# Patient Record
Sex: Male | Born: 1975 | Race: Black or African American | Hispanic: No | Smoking: Current every day smoker
Health system: Southern US, Community
[De-identification: ages and names within clinical notes are randomized; demographics above are authoritative.]

## PROBLEM LIST (undated history)

## (undated) DIAGNOSIS — A048 Other specified bacterial intestinal infections: Secondary | ICD-10-CM

## (undated) DIAGNOSIS — E119 Type 2 diabetes mellitus without complications: Secondary | ICD-10-CM

## (undated) DIAGNOSIS — E785 Hyperlipidemia, unspecified: Secondary | ICD-10-CM

## (undated) DIAGNOSIS — E669 Obesity, unspecified: Secondary | ICD-10-CM

## (undated) DIAGNOSIS — K8689 Other specified diseases of pancreas: Secondary | ICD-10-CM

## (undated) DIAGNOSIS — K760 Fatty (change of) liver, not elsewhere classified: Secondary | ICD-10-CM

## (undated) HISTORY — DX: Other specified diseases of pancreas: K86.89

## (undated) HISTORY — DX: Fatty (change of) liver, not elsewhere classified: K76.0

## (undated) HISTORY — DX: Type 2 diabetes mellitus without complications: E11.9

## (undated) HISTORY — DX: Hyperlipidemia, unspecified: E78.5

## (undated) HISTORY — DX: Obesity, unspecified: E66.9

## (undated) HISTORY — DX: Other specified bacterial intestinal infections: A04.8

---

## 1998-07-06 ENCOUNTER — Emergency Department (HOSPITAL_COMMUNITY): Admission: EM | Admit: 1998-07-06 | Discharge: 1998-07-07 | Payer: Self-pay | Admitting: Internal Medicine

## 2003-03-30 ENCOUNTER — Emergency Department (HOSPITAL_COMMUNITY): Admission: EM | Admit: 2003-03-30 | Discharge: 2003-03-30 | Payer: Self-pay | Admitting: Emergency Medicine

## 2004-09-29 ENCOUNTER — Emergency Department (HOSPITAL_COMMUNITY): Admission: EM | Admit: 2004-09-29 | Discharge: 2004-09-29 | Payer: Self-pay | Admitting: Emergency Medicine

## 2006-10-04 ENCOUNTER — Emergency Department (HOSPITAL_COMMUNITY): Admission: EM | Admit: 2006-10-04 | Discharge: 2006-10-05 | Payer: Self-pay | Admitting: Emergency Medicine

## 2006-11-13 ENCOUNTER — Emergency Department (HOSPITAL_COMMUNITY): Admission: EM | Admit: 2006-11-13 | Discharge: 2006-11-13 | Payer: Self-pay | Admitting: Emergency Medicine

## 2007-03-28 ENCOUNTER — Emergency Department (HOSPITAL_COMMUNITY): Admission: EM | Admit: 2007-03-28 | Discharge: 2007-03-29 | Payer: Self-pay | Admitting: Emergency Medicine

## 2007-04-06 ENCOUNTER — Emergency Department (HOSPITAL_COMMUNITY): Admission: EM | Admit: 2007-04-06 | Discharge: 2007-04-06 | Payer: Self-pay | Admitting: Emergency Medicine

## 2007-12-26 HISTORY — PX: TOOTH EXTRACTION: SUR596

## 2015-05-28 ENCOUNTER — Encounter (HOSPITAL_COMMUNITY): Payer: Self-pay

## 2015-05-28 ENCOUNTER — Emergency Department (INDEPENDENT_AMBULATORY_CARE_PROVIDER_SITE_OTHER)
Admission: EM | Admit: 2015-05-28 | Discharge: 2015-05-28 | Disposition: A | Discharge status: Home / Self Care | Payer: Medicaid Other | Source: Home / Self Care | Attending: Family Medicine | Admitting: Family Medicine

## 2015-05-28 DIAGNOSIS — L255 Unspecified contact dermatitis due to plants, except food: Secondary | ICD-10-CM

## 2015-05-28 MED ORDER — TRIAMCINOLONE ACETONIDE 0.1 % EX CREA: 1.0000 "application " | TOPICAL_CREAM | Freq: Two times a day (BID) | CUTANEOUS | Status: DC

## 2015-05-28 MED ORDER — TRIAMCINOLONE ACETONIDE 40 MG/ML IJ SUSP
40.0000 mg | Freq: Once | INTRAMUSCULAR | Status: AC
  Administered 2015-05-28: 40 mg via INTRAMUSCULAR

## 2015-05-28 MED ORDER — TRIAMCINOLONE ACETONIDE 40 MG/ML IJ SUSP
INTRAMUSCULAR | Status: AC
  Filled 2015-05-28: qty 1

## 2015-05-28 MED ORDER — PREDNISONE 20 MG PO TABS: ORAL_TABLET | ORAL | Status: DC

## 2015-05-28 NOTE — ED Notes (Signed)
Noted rash on arms, face, abdominal area since working outside in [place]

## 2015-05-28 NOTE — Discharge Instructions (Signed)
Poison Ivy May also use caladryl lotion after using the triamcinolone. Take CLaritin or Zyrtec, may use benadryl at home. Start the prednisone tomorrow. Poison ivy is a inflammation of the skin (contact dermatitis) caused by touching the allergens on the leaves of the ivy plant following previous exposure to the plant. The rash usually appears 48 hours after exposure. The rash is usually bumps (papules) or blisters (vesicles) in a linear pattern. Depending on your own sensitivity, the rash may simply cause redness and itching, or it may also progress to blisters which may break open. These must be well cared for to prevent secondary bacterial (germ) infection, followed by scarring. Keep any open areas dry, clean, dressed, and covered with an antibacterial ointment if needed. The eyes may also get puffy. The puffiness is worst in the morning and gets better as the day progresses. This dermatitis usually heals without scarring, within 2 to 3 weeks without treatment. HOME CARE INSTRUCTIONS  Thoroughly wash with soap and water as soon as you have been exposed to poison ivy. You have about one half hour to remove the plant resin before it will cause the rash. This washing will destroy the oil or antigen on the skin that is causing, or will cause, the rash. Be sure to wash under your fingernails as any plant resin there will continue to spread the rash. Do not rub skin vigorously when washing affected area. Poison ivy cannot spread if no oil from the plant remains on your body. A rash that has progressed to weeping sores will not spread the rash unless you have not washed thoroughly. It is also important to wash any clothes you have been wearing as these may carry active allergens. The rash will return if you wear the unwashed clothing, even several days later. Avoidance of the plant in the future is the best measure. Poison ivy plant can be recognized by the number of leaves. Generally, poison ivy has three leaves  with flowering branches on a single stem. Diphenhydramine may be purchased over the counter and used as needed for itching. Do not drive with this medication if it makes you drowsy.Ask your caregiver about medication for children. SEEK MEDICAL CARE IF:  Open sores develop.  Redness spreads beyond area of rash.  You notice purulent (pus-like) discharge.  You have increased pain.  Other signs of infection develop (such as fever). Document Released: 12/08/2000 Document Revised: 03/04/2012 Document Reviewed: 05/21/2009 [facility] Patient Information 2015 [place], [place]. This information is not intended to replace advice given to you by your health care provider. Make sure you discuss any questions you have with your health care provider.  Contact Dermatitis Contact dermatitis is a reaction to certain substances that touch the skin. Contact dermatitis can be either irritant contact dermatitis or allergic contact dermatitis. Irritant contact dermatitis does not require previous exposure to the substance for a reaction to occur.Allergic contact dermatitis only occurs if you have been exposed to the substance before. Upon a repeat exposure, your body reacts to the substance.  CAUSES  Many substances can cause contact dermatitis. Irritant dermatitis is most commonly caused by repeated exposure to mildly irritating substances, such as:  Makeup.  Soaps.  Detergents.  Bleaches.  Acids.  Metal salts, such as nickel. Allergic contact dermatitis is most commonly caused by exposure to:  Poisonous plants.  Chemicals (deodorants, shampoos).  Jewelry.  Latex.  Neomycin in triple antibiotic cream.  Preservatives in products, including clothing. SYMPTOMS  The area of skin that is  exposed may develop:  Dryness or flaking.  Redness.  Cracks.  Itching.  Pain or a burning sensation.  Blisters. With allergic contact dermatitis, there may also be swelling in areas such as the eyelids,  mouth, or genitals.  DIAGNOSIS  Your caregiver can usually tell what the problem is by doing a physical exam. In cases where the cause is uncertain and an allergic contact dermatitis is suspected, a patch skin test may be performed to help determine the cause of your dermatitis. TREATMENT Treatment includes protecting the skin from further contact with the irritating substance by avoiding that substance if possible. Barrier creams, powders, and gloves may be helpful. Your caregiver may also recommend:  Steroid creams or ointments applied 2 times daily. For best results, soak the rash area in cool water for 20 minutes. Then apply the medicine. Cover the area with a plastic wrap. You can store the steroid cream in the refrigerator for a "chilly" effect on your rash. That may decrease itching. Oral steroid medicines may be needed in more severe cases.  Antibiotics or antibacterial ointments if a skin infection is present.  Antihistamine lotion or an antihistamine taken by mouth to ease itching.  Lubricants to keep moisture in your skin.  Burow's solution to reduce redness and soreness or to dry a weeping rash. Mix one packet or tablet of solution in 2 cups cool water. Dip a clean washcloth in the mixture, wring it out a bit, and put it on the affected area. Leave the cloth in place for 30 minutes. Do this as often as possible throughout the day.  Taking several cornstarch or baking soda baths daily if the area is too large to cover with a washcloth. Harsh chemicals, such as alkalis or acids, can cause skin damage that is like a burn. You should flush your skin for 15 to 20 minutes with cold water after such an exposure. You should also seek immediate medical care after exposure. Bandages (dressings), antibiotics, and pain medicine may be needed for severely irritated skin.  HOME CARE INSTRUCTIONS  Avoid the substance that caused your reaction.  Keep the area of skin that is affected away from hot  water, soap, sunlight, chemicals, acidic substances, or anything else that would irritate your skin.  Do not scratch the rash. Scratching may cause the rash to become infected.  You may take cool baths to help stop the itching.  Only take over-the-counter or prescription medicines as directed by your caregiver.  See your caregiver for follow-up care as directed to make sure your skin is healing properly. SEEK MEDICAL CARE IF:   Your condition is not better after 3 days of treatment.  You seem to be getting worse.  You see signs of infection such as swelling, tenderness, redness, soreness, or warmth in the affected area.  You have any problems related to your medicines. Document Released: 12/08/2000 Document Revised: 03/04/2012 Document Reviewed: 05/16/2011 [facility] Patient Information 2015 [place], [place]. This information is not intended to replace advice given to you by your health care provider. Make sure you discuss any questions you have with your health care provider.

## 2015-05-28 NOTE — ED Provider Notes (Signed)
CSN: [id number]     Arrival date & time 05/28/15  1901 History   First MD Initiated Contact with Patient 05/28/15 2019     Chief Complaint  Patient presents with  . Skin Problem   (Consider location/radiation/quality/duration/timing/severity/associated sxs/prior Treatment) HPI Comments: 39 year old male was pulling down vines from a tree one week ago. The next day he developed a crusty, itchy rash to the mid chest, both arms and face. He is complaining of a nature rash. Denies involvement of the eyes. Denies systemic symptoms.   History reviewed. No pertinent past medical history. History reviewed. No pertinent past surgical history. History reviewed. No pertinent family history. History  Substance Use Topics  . Smoking status: Current Every Day Smoker  . Smokeless tobacco: Not on file  . Alcohol Use: Not on file    Review of Systems  Constitutional: Negative.   Skin: Positive for rash.  All other systems reviewed and are negative.   Allergies  Review of patient's allergies indicates not on file.  Home Medications   Prior to Admission medications   Medication Sig Start Date End Date Taking? Authorizing Provider  predniSONE (DELTASONE) 20 MG tablet 3 Tabs PO Days 1-3, then 2 tabs PO Days 4-6, then 1 tab PO Day 7-9, then Half Tab PO Day 10-12 05/28/15   Janne Napoleon, NP  triamcinolone cream (KENALOG) 0.1 % Apply 1 application topically 2 (two) times daily. 05/28/15   Janne Napoleon, NP   BP 129/89 mmHg  Pulse 91  Temp(Src) 98.7 F (37.1 C) (Oral)  Resp 16  SpO2 95% Physical Exam  Constitutional: He is oriented to person, place, and time. He appears well-developed and well-nourished. No distress.  Neck: Normal range of motion. Neck supple.  Pulmonary/Chest: Effort normal. No respiratory distress.  Musculoskeletal: He exhibits no edema.  Neurological: He is alert and oriented to person, place, and time. He exhibits normal muscle tone.  Skin: Skin is warm and dry.  Papular and  papulovesicular lesions to the anterior chest, bilateral arms and face. Several lesions are draining  serous type fluid.  Psychiatric: He has a normal mood and affect.  Nursing note and vitals reviewed.   ED Course  Procedures (including critical care time) Labs Review Labs Reviewed - No data to display  Imaging Review No results found.   MDM   1. Rhus dermatitis   Kenalog 40 mg IM now. May also use caladryl lotion after using the triamcinolone cream BID Take CLaritin or Zyrtec, may use benadryl at home. Start the prednisone tomorrow. Cold compresses   Janne Napoleon, NP 05/28/15 2055

## 2016-09-26 ENCOUNTER — Encounter (HOSPITAL_COMMUNITY): Payer: Self-pay | Admitting: Nurse Practitioner

## 2016-09-26 ENCOUNTER — Emergency Department (HOSPITAL_COMMUNITY)
Admission: EM | Admit: 2016-09-26 | Discharge: 2016-09-26 | Disposition: A | Discharge status: Home / Self Care | Payer: Medicaid Other | Attending: Emergency Medicine | Admitting: Emergency Medicine

## 2016-09-26 DIAGNOSIS — Z87891 Personal history of nicotine dependence: Secondary | ICD-10-CM | POA: Insufficient documentation

## 2016-09-26 DIAGNOSIS — Z8719 Personal history of other diseases of the digestive system: Secondary | ICD-10-CM

## 2016-09-26 DIAGNOSIS — K625 Hemorrhage of anus and rectum: Secondary | ICD-10-CM | POA: Insufficient documentation

## 2016-09-26 LAB — COMPREHENSIVE METABOLIC PANEL
ALT: 28 U/L (ref 17–63)
ANION GAP: 6 (ref 5–15)
AST: 23 U/L (ref 15–41)
Albumin: 3.8 g/dL (ref 3.5–5.0)
Alkaline Phosphatase: 59 U/L (ref 38–126)
BUN: 8 mg/dL (ref 6–20)
CHLORIDE: 108 mmol/L (ref 101–111)
CO2: 24 mmol/L (ref 22–32)
Calcium: 9.4 mg/dL (ref 8.9–10.3)
Creatinine, Ser: 0.89 mg/dL (ref 0.61–1.24)
GFR calc Af Amer: >60 mL/min (ref >60)
GFR calc non Af Amer: >60 mL/min (ref >60)
Glucose, Bld: 85 mg/dL (ref 65–99)
Potassium: 3.9 mmol/L (ref 3.5–5.1)
SODIUM: 138 mmol/L (ref 135–145)
Total Bilirubin: 0.4 mg/dL (ref 0.3–1.2)
Total Protein: 7.4 g/dL (ref 6.5–8.1)

## 2016-09-26 LAB — CBC WITH DIFFERENTIAL/PLATELET
Basophils Absolute: 0.1 10*3/uL (ref 0.0–0.1)
Basophils Relative: 1 %
Eosinophils Absolute: 0.3 10*3/uL (ref 0.0–0.7)
Eosinophils Relative: 5 %
HCT: 47.2 % (ref 39.0–52.0)
Hemoglobin: 15.6 g/dL (ref 13.0–17.0)
Lymphocytes Relative: 41 %
Lymphs Abs: 2.5 10*3/uL (ref 0.7–4.0)
MCH: 27.8 pg (ref 26.0–34.0)
MCHC: 33.1 g/dL (ref 30.0–36.0)
MCV: 84.1 fL (ref 78.0–100.0)
Monocytes Absolute: 0.7 10*3/uL (ref 0.1–1.0)
Monocytes Relative: 12 %
Neutro Abs: 2.5 10*3/uL (ref 1.7–7.7)
Neutrophils Relative %: 41 %
Platelets: 276 10*3/uL (ref 150–400)
RBC: 5.61 MIL/uL (ref 4.22–5.81)
RDW: 13.1 % (ref 11.5–15.5)
WBC: 6 10*3/uL (ref 4.0–10.5)

## 2016-09-26 LAB — LIPASE, BLOOD: LIPASE: 31 U/L (ref 11–51)

## 2016-09-26 LAB — POC OCCULT BLOOD, ED: Fecal Occult Bld: NEGATIVE

## 2016-09-26 NOTE — ED Provider Notes (Signed)
[facility] DEPT Provider Note   CSN: [id number] Arrival date & time: 09/26/16  1053     History   Chief Complaint Chief Complaint  Patient presents with  . Rectal Bleeding    HPI Brian Diaz is a 40 y.o. male.  40 year old healthy male who presents with rectal bleeding. 2 days ago, the patient had a bowel movement and noticed that there was dark red blood in the toilet water. Yesterday he had another bowel movement in which he saw a small clot and also dark red toilet water. He has not had a bowel movement today. He denies any history of preceding constipation or diarrhea. No associated nausea or vomiting. He had occasional brief, sharp abdominal pains near his umbilicus yesterday but denies any pain today. No rectal pain during or after bowel movements. No history of hemorrhoids. No NSAID or alcohol use. No family history of IBD or early colon cancer.   The history is provided by the patient.    History reviewed. No pertinent past medical history.  There are no active problems to display for this patient.   History reviewed. No pertinent surgical history.     Home Medications    Prior to Admission medications   Medication Sig Start Date End Date Taking? Authorizing Provider  predniSONE (DELTASONE) 20 MG tablet 3 Tabs PO Days 1-3, then 2 tabs PO Days 4-6, then 1 tab PO Day 7-9, then Half Tab PO Day 10-12 05/28/15   Janne Napoleon, NP  triamcinolone cream (KENALOG) 0.1 % Apply 1 application topically 2 (two) times daily. 05/28/15   Janne Napoleon, NP    Family History History reviewed. No pertinent family history.  Social History Social History  Substance Use Topics  . Smoking status: Former Smoker    Quit date: 09/27/2007  . Smokeless tobacco: Never Used  . Alcohol use Yes     Comment: occasional      Allergies   Review of patient's allergies indicates no known allergies.   Review of Systems Review of Systems 10 Systems reviewed and are negative for acute  change except as noted in the HPI.   Physical Exam Updated Vital Signs BP 126/96 (BP Location: Left Arm)   Pulse 81   Temp 98.3 F (36.8 C) (Oral)   Resp 18   Ht 5\' 9"  (1.753 m)   Wt 300 lb (136.1 kg)   SpO2 99%   BMI 44.30 kg/m   Physical Exam  Constitutional: He is oriented to person, place, and time. He appears well-developed and well-nourished. No distress.  HENT:  Head: Normocephalic and atraumatic.  Moist mucous membranes  Eyes: Conjunctivae are normal. Pupils are equal, round, and reactive to light.  Neck: Neck supple.  Cardiovascular: Normal rate, regular rhythm and normal heart sounds.   No murmur heard. Pulmonary/Chest: Effort normal and breath sounds normal.  Abdominal: Soft. Bowel sounds are normal. He exhibits no distension. There is no tenderness.  Genitourinary:  Genitourinary Comments: Normal rectal tone, no gross blood  Musculoskeletal: He exhibits no edema.  Neurological: He is alert and oriented to person, place, and time.  Fluent speech  Skin: Skin is warm and dry.  Psychiatric: He has a normal mood and affect. Judgment normal.  Nursing note and vitals reviewed.  10 Systems reviewed and are negative for acute change except as noted in the HPI.   ED Treatments / Results  Labs (all labs ordered are listed, but only abnormal results are displayed) Labs Reviewed  COMPREHENSIVE METABOLIC PANEL  LIPASE, BLOOD  CBC WITH DIFFERENTIAL/PLATELET  POC OCCULT BLOOD, ED    EKG  EKG Interpretation None       Radiology No results found.  Procedures Procedures (including critical care time)  Medications Ordered in ED Medications - No data to display   Initial Impression / Assessment and Plan / ED Course  I have reviewed the triage vital signs and the nursing notes.  Pertinent labs that were available during my care of the patient were reviewed by me and considered in my medical decision making (see chart for details).  Clinical Course     Patient with 2 bowel movements over the past 2 days with dark red blood. No bowel movements today. He was well-appearing with reassuring vital signs on exam. No complaints of pain and no abdominal tenderness.  All labwork unremarkable and Hemoccult negative. He was well-appearing on reexamination.  Discussed with him supportive care including avoidance of NSAIDs and alcohol and importance of follow-up with PCP to arrange for colonoscopy if his symptoms continue. Extensively reviewed return precautions including heavy bleeding, abdominal pain, or lightheadedness. Patient voiced understanding and was discharged in satisfactory condition. Final Clinical Impressions(s) / ED Diagnoses   Final diagnoses:  History of bloody stools    New Prescriptions New Prescriptions   No medications on file     Sharlett Iles, MD 09/26/16 1442

## 2016-09-26 NOTE — ED Triage Notes (Signed)
Pt endorses rectal bleeding started on Sunday. Pt sts had a BM and looked in toilet and it was dark red toilet water. Pt sts yesterday had BM and patient saw small clot and dark red toilet water. Pt denies abdominal pain at present time but notes intermittent mid sharp abdominal pain a few times lasting a few seconds. Patient denies n/v/d/constipation. Pt sts he has regular daily BMs. Denies hx of hemorrhoids

## 2016-10-13 ENCOUNTER — Ambulatory Visit (HOSPITAL_COMMUNITY)
Admission: EM | Admit: 2016-10-13 | Discharge: 2016-10-13 | Discharge status: Absent without leave | Payer: Medicaid Other

## 2016-12-19 ENCOUNTER — Emergency Department (HOSPITAL_COMMUNITY): Payer: Medicaid Other

## 2016-12-19 ENCOUNTER — Emergency Department (HOSPITAL_COMMUNITY)
Admission: EM | Admit: 2016-12-19 | Discharge: 2016-12-19 | Disposition: A | Discharge status: Home / Self Care | Payer: Medicaid Other | Attending: Emergency Medicine | Admitting: Emergency Medicine

## 2016-12-19 ENCOUNTER — Encounter (HOSPITAL_COMMUNITY): Payer: Self-pay | Admitting: Vascular Surgery

## 2016-12-19 DIAGNOSIS — R52 Pain, unspecified: Secondary | ICD-10-CM

## 2016-12-19 DIAGNOSIS — R1010 Upper abdominal pain, unspecified: Secondary | ICD-10-CM | POA: Diagnosis present

## 2016-12-19 DIAGNOSIS — R1013 Epigastric pain: Secondary | ICD-10-CM

## 2016-12-19 DIAGNOSIS — Z87891 Personal history of nicotine dependence: Secondary | ICD-10-CM | POA: Diagnosis not present

## 2016-12-19 LAB — URINALYSIS, ROUTINE W REFLEX MICROSCOPIC
Bilirubin Urine: NEGATIVE
Glucose, UA: NEGATIVE mg/dL
Ketones, ur: 5 mg/dL — AB
Leukocytes, UA: NEGATIVE
Nitrite: NEGATIVE
Protein, ur: 30 mg/dL — AB
Specific Gravity, Urine: 1.018 (ref 1.005–1.030)
pH: 5 (ref 5.0–8.0)

## 2016-12-19 LAB — COMPREHENSIVE METABOLIC PANEL
ALT: 28 U/L (ref 17–63)
AST: 28 U/L (ref 15–41)
Albumin: 3.9 g/dL (ref 3.5–5.0)
Alkaline Phosphatase: 59 U/L (ref 38–126)
Anion gap: 10 (ref 5–15)
BUN: 10 mg/dL (ref 6–20)
CO2: 25 mmol/L (ref 22–32)
Calcium: 9.6 mg/dL (ref 8.9–10.3)
Chloride: 102 mmol/L (ref 101–111)
Creatinine, Ser: 1.18 mg/dL (ref 0.61–1.24)
GFR calc Af Amer: >60 mL/min (ref >60)
GFR calc non Af Amer: >60 mL/min (ref >60)
Glucose, Bld: 103 mg/dL — ABNORMAL HIGH (ref 65–99)
Potassium: 4 mmol/L (ref 3.5–5.1)
Sodium: 137 mmol/L (ref 135–145)
Total Bilirubin: 0.5 mg/dL (ref 0.3–1.2)
Total Protein: 7.7 g/dL (ref 6.5–8.1)

## 2016-12-19 LAB — CBC
HCT: 45.3 % (ref 39.0–52.0)
Hemoglobin: 15.7 g/dL (ref 13.0–17.0)
MCH: 28.4 pg (ref 26.0–34.0)
MCHC: 34.7 g/dL (ref 30.0–36.0)
MCV: 82.1 fL (ref 78.0–100.0)
Platelets: 347 10*3/uL (ref 150–400)
RBC: 5.52 MIL/uL (ref 4.22–5.81)
RDW: 12.7 % (ref 11.5–15.5)
WBC: 6.5 10*3/uL (ref 4.0–10.5)

## 2016-12-19 LAB — LIPASE, BLOOD: Lipase: 71 U/L — ABNORMAL HIGH (ref 11–51)

## 2016-12-19 MED ORDER — PANTOPRAZOLE SODIUM 40 MG PO TBEC: 40.0000 mg | DELAYED_RELEASE_TABLET | Freq: Every day | ORAL | 0 refills | Status: DC

## 2016-12-19 MED ORDER — FAMOTIDINE 20 MG PO TABS
20.0000 mg | ORAL_TABLET | Freq: Once | ORAL | Status: AC
  Administered 2016-12-19: 20 mg via ORAL
  Filled 2016-12-19: qty 1

## 2016-12-19 MED ORDER — GI COCKTAIL ~~LOC~~
30.0000 mL | Freq: Once | ORAL | Status: AC
  Administered 2016-12-19: 30 mL via ORAL
  Filled 2016-12-19: qty 30

## 2016-12-19 NOTE — ED Triage Notes (Signed)
Pt reports to the ED for eval of abd pain after eating since last Saturday. Pt reports every time he eats anything he gets severe upper abd pain. Pt reports nausea in the am but denies any active V/D. Pt reports pain usually come on about an hour after eating. Pt still has his gall bladder.

## 2016-12-19 NOTE — Discharge Instructions (Addendum)
It was our pleasure to provide your ER care today - we hope that you feel better.  Take protonix as prescribed.  Avoid taking any anti-inflammatory type pain medication such as motrin, aleve, goodys, or aspirin.   As we discussed, we did send a 'H. Pylori' test that remains pending - the results should be back in the next day.  Follow up with GI doctor in the next 1-2 weeks - see referral - call office tomorrow AM to arrange appointment.   Return to ER if worse, new symptoms, worsening or severe pain, persistent vomiting, fevers, chest pain, other concern.

## 2016-12-19 NOTE — ED Notes (Signed)
Patient transported to [facility] 

## 2016-12-19 NOTE — ED Provider Notes (Addendum)
[facility] DEPT Provider Note   CSN: [id number] Arrival date & time: 12/19/16  1346   By signing my name below, I, Brian Diaz, attest that this documentation has been prepared under the direction and in the presence of Brian Saver, MD. Electronically Signed: Collene Diaz, Scribe. 12/19/16. 5:35 PM.  History   Chief Complaint Chief Complaint  Patient presents with  . Abdominal Pain   HPI Comments: Brian Diaz is a 40 y.o. male who presents to the Emergency Department complaining of gradually worsening, intermittent upper abdominal pain that radiates to the lower back that began 11 days ago. Patient states the symptoms become prominently  worse after eating. He reports he usually has abdominal pain but initially thought it was due to eating spicy food. He has had no prior abdominal surgeries or abdominal problems. He denies any vomiting, diarrhea, and cold or flu symptoms.    The history is provided by the patient. No language interpreter was used.    History reviewed. No pertinent past medical history.  There are no active problems to display for this patient.   History reviewed. No pertinent surgical history.     Home Medications    Prior to Admission medications   Medication Sig Start Date End Date Taking? Authorizing Provider  predniSONE (DELTASONE) 20 MG tablet 3 Tabs PO Days 1-3, then 2 tabs PO Days 4-6, then 1 tab PO Day 7-9, then Half Tab PO Day 10-12 05/28/15   Janne Napoleon, NP  triamcinolone cream (KENALOG) 0.1 % Apply 1 application topically 2 (two) times daily. 05/28/15   Janne Napoleon, NP    Family History No family history on file.  Social History Social History  Substance Use Topics  . Smoking status: Former Smoker    Quit date: 09/27/2007  . Smokeless tobacco: Never Used  . Alcohol use Yes     Comment: occasional      Allergies   Patient has no known allergies.   Review of Systems Review of Systems  Constitutional: Positive for appetite  change. Negative for chills and fever.  Respiratory: Negative for shortness of breath.   Cardiovascular: Negative for chest pain and leg swelling.  Genitourinary: Negative for dysuria and hematuria.  All other systems reviewed and are negative.    Physical Exam Updated Vital Signs BP 125/87 (BP Location: Right Arm)   Pulse 86   Temp 98.1 F (36.7 C) (Oral)   Resp 18   SpO2 95%   Physical Exam  Constitutional: He is oriented to person, place, and time. He appears well-developed and well-nourished.  HENT:  Head: Normocephalic and atraumatic.  Eyes: EOM are normal.  Neck: Normal range of motion.  Cardiovascular: Normal rate, regular rhythm, normal heart sounds and intact distal pulses.  Exam reveals no gallop and no friction rub.   No murmur heard. Pulmonary/Chest: Effort normal and breath sounds normal. No respiratory distress.  Abdominal: Soft. He exhibits no distension. There is tenderness.  Mild ruq tenderness, no rebound or guarding.   Musculoskeletal: Normal range of motion. He exhibits no edema or tenderness.  Neurological: He is alert and oriented to person, place, and time.  Skin: Skin is warm and dry.  Psychiatric: He has a normal mood and affect. Judgment normal.  Nursing note and vitals reviewed.    ED Treatments / Results  DIAGNOSTIC STUDIES: Oxygen Saturation is 95% on RA, normal by my intepretation.    COORDINATION OF CARE: 5:35 PM Discussed treatment plan with pt at bedside and pt agreed to  plan.  Labs (all labs ordered are listed, but only abnormal results are displayed) Results for orders placed or performed during the hospital encounter of 12/19/16  Lipase, blood  Result Value Ref Range   Lipase 71 (H) 11 - 51 U/L  Comprehensive metabolic panel  Result Value Ref Range   Sodium 137 135 - 145 mmol/L   Potassium 4.0 3.5 - 5.1 mmol/L   Chloride 102 101 - 111 mmol/L   CO2 25 22 - 32 mmol/L   Glucose, Bld 103 (H) 65 - 99 mg/dL   BUN 10 6 - 20 mg/dL    Creatinine, Ser 1.18 0.61 - 1.24 mg/dL   Calcium 9.6 8.9 - 10.3 mg/dL   Total Protein 7.7 6.5 - 8.1 g/dL   Albumin 3.9 3.5 - 5.0 g/dL   AST 28 15 - 41 U/L   ALT 28 17 - 63 U/L   Alkaline Phosphatase 59 38 - 126 U/L   Total Bilirubin 0.5 0.3 - 1.2 mg/dL   GFR calc non Af Amer >60 >60 mL/min   GFR calc Af Amer >60 >60 mL/min   Anion gap 10 5 - 15  CBC  Result Value Ref Range   WBC 6.5 4.0 - 10.5 K/uL   RBC 5.52 4.22 - 5.81 MIL/uL   Hemoglobin 15.7 13.0 - 17.0 g/dL   HCT 45.3 39.0 - 52.0 %   MCV 82.1 78.0 - 100.0 fL   MCH 28.4 26.0 - 34.0 pg   MCHC 34.7 30.0 - 36.0 g/dL   RDW 12.7 11.5 - 15.5 %   Platelets 347 150 - 400 K/uL  Urinalysis, Routine w reflex microscopic  Result Value Ref Range   Color, Urine YELLOW YELLOW   APPearance CLEAR CLEAR   Specific Gravity, Urine 1.018 1.005 - 1.030   pH 5.0 5.0 - 8.0   Glucose, UA NEGATIVE NEGATIVE mg/dL   Hgb urine dipstick SMALL (A) NEGATIVE   Bilirubin Urine NEGATIVE NEGATIVE   Ketones, ur 5 (A) NEGATIVE mg/dL   Protein, ur 30 (A) NEGATIVE mg/dL   Nitrite NEGATIVE NEGATIVE   Leukocytes, UA NEGATIVE NEGATIVE   RBC / HPF 0-5 0 - 5 RBC/hpf   WBC, UA 0-5 0 - 5 WBC/hpf   Bacteria, UA RARE (A) NONE SEEN   Squamous Epithelial / LPF 0-5 (A) NONE SEEN   Mucous PRESENT    US Abdomen Limited Ruq  Result Date: 12/19/2016 CLINICAL DATA:  40 year old male with history of upper abdominal pain for the past 11 days, worse with ingestion of food. EXAM: US ABDOMEN LIMITED - RIGHT UPPER QUADRANT COMPARISON:  No priors. FINDINGS: Gallbladder: No gallstones or wall thickening visualized. No sonographic Murphy sign noted by sonographer. Common bile duct: Diameter: 3 mm Liver: No focal lesion identified. Diffusely increased hepatic echogenicity, suggesting underlying hepatic steatosis. IMPRESSION: 1. No acute findings. Specifically, no gallstones or findings to suggest an acute cholecystitis at this time. 2. Increased hepatic echogenicity, suggestive of  hepatic steatosis. Electronically Signed   By: Vinnie Langton M.D.   On: 12/19/2016 18:59    EKG  EKG Interpretation None       Radiology No results found.  Procedures Procedures (including critical care time)  Medications Ordered in ED Medications - No data to display   Initial Impression / Assessment and Plan / ED Course  I have reviewed the triage vital signs and the nursing notes.  Pertinent labs & imaging results that were available during my care of the patient were reviewed by  me and considered in my medical decision making (see chart for details).  Clinical Course    Labs/ultrasound.  Reviewed nursing notes and prior charts for additional history.   On recheck, abd soft nt, and pain resolved.   Discussed diff dx including pud, duod ulcer, other gi causes upper abd pain.  Patient denies any cp or discomfort. No sob.  Discussed w pt, h pylori pending, and that result should be available within the next day.   Will rec protonix, and gi f/u.   Final Clinical Impressions(s) / ED Diagnoses   Final diagnoses:  None    New Prescriptions New Prescriptions   No medications on file   I personally performed the services described in this documentation, which was scribed in my presence. The recorded information has been reviewed and considered. Brian Saver, MD      Brian Saver, MD 12/19/16 [phone]

## 2016-12-20 ENCOUNTER — Encounter: Payer: Self-pay | Admitting: Physician Assistant

## 2016-12-21 LAB — H. PYLORI ANTIBODY, IGG: H Pylori IgG: >8 U/mL — ABNORMAL HIGH (ref 0.0–0.8)

## 2017-01-02 ENCOUNTER — Ambulatory Visit (INDEPENDENT_AMBULATORY_CARE_PROVIDER_SITE_OTHER): Payer: Medicaid Other | Admitting: Physician Assistant

## 2017-01-02 ENCOUNTER — Encounter: Payer: Self-pay | Admitting: Physician Assistant

## 2017-01-02 VITALS — BP 120/88 | HR 88 | Ht 68.5 in | Wt 276.8 lb

## 2017-01-02 DIAGNOSIS — A048 Other specified bacterial intestinal infections: Secondary | ICD-10-CM

## 2017-01-02 DIAGNOSIS — R1013 Epigastric pain: Secondary | ICD-10-CM

## 2017-01-02 MED ORDER — PANTOPRAZOLE SODIUM 40 MG PO TBEC: 40.0000 mg | DELAYED_RELEASE_TABLET | Freq: Two times a day (BID) | ORAL | 1 refills | Status: DC

## 2017-01-02 NOTE — Progress Notes (Signed)
Chief Complaint: Epigastric Pain  HPI:   Brian Diaz is a 41 year old male who presents to clinic today as a new patient after being told to follow with our office from Dr. Ashok Cordia in the ED, with a chief complaint of epigastric pain.   According to chart review patient was seen in the ED on 12/19/16 and complained of epigastric pain which had been radiating to his lower back for the past 11 days and gradually worsening. His symptoms were worse with eating. Patient had labs to include a CMP and CBC which were normal. Lipase was minimally elevated at 71. Patient also had an H. pylori antibody IgG test which recently returned positive. The patient was started on Clarithromycin 500 mg twice a day 14 days, Amoxicillin 1 g twice a day 14 days and instructed to take his Protonix twice a day. He was also prescribed Protonix 40 mg.   Today, the patient tells me that he started the Protonix once daily after being seen in the ER on the 26th and felt better after about 3 days of this medication. He was doing well over this past couple of weeks. He also started his antibiotics around the first or second of January for H. pylori. He tells me he had minimal discomfort if at all, but then on Sunday, 12/31/16 after eating quite a large meal for dinner about 2 hours later he started with epigastric pain which was at first an 8-10/10 and he stopped eating for the next day and a half. Patient tells me that yesterday this pain was a 5/10 and this morning this is a 1-2/10. The patient has continued to take his antibiotics but has only been using his Pantoprazole 40 mg once daily. Patient describes this pain as sharp in nature, currently it feels like a "rumbling in there". Sometimes this radiates through to his back when it increases in intensity. Patient tells me he only had intermittent reflux symptoms before this time, not even " once a month". Patient does admit to being on Naproxen, "quite a high-dose around 500 mg", for  about a 3 week time period from late October through November for a wrist strain. Since starting with epigastric pain he has only been using Tylenol. He does admit to sodas at least 12-16 ounces per day but denies any other aggravating factors.   Patient denies fever, chills, blood in his stool, melena, weight loss, fatigue, anorexia, vomiting, dysphagia or symptoms that awaken her at night.  Past Medical History: None  Current Outpatient Prescriptions  Medication Sig Dispense Refill  . amoxicillin (AMOXIL) 500 MG capsule Take 500 mg by mouth 2 (two) times daily. Take 2 tabs twice daily.    . clarithromycin (BIAXIN) 500 MG tablet Take 500 mg by mouth 2 (two) times daily. Take 1 tab twice dailyl    . pantoprazole (PROTONIX) 40 MG tablet Take 1 tablet (40 mg total) by mouth daily. 30 tablet 0   No current facility-administered medications for this visit.     Allergies as of 01/02/2017  . (No Known Allergies)    Family History  Problem Relation Age of Onset  . Heart disease Mother   . COPD Mother   . Colon cancer Neg Hx     Social History   Social History  . Marital status: Single    Spouse name: N/A  . Number of children: N/A  . Years of education: N/A   Occupational History  . Not on file.   Social History  Main Topics  . Smoking status: Former Smoker    Quit date: 09/27/2007  . Smokeless tobacco: Never Used  . Alcohol use Yes     Comment: occasional   . Drug use: No  . Sexual activity: Yes   Other Topics Concern  . Not on file   Social History Narrative  . No narrative on file    Review of Systems:    Constitutional: No weight loss, fever or chills Skin: No rash  Cardiovascular: No chest pain Respiratory: No SOB Gastrointestinal: See HPI and otherwise negative Genitourinary: No dysuria Neurological: No headache Musculoskeletal: No new muscle or joint pain Hematologic: No bleeding Psychiatric: No history of depression or anxiety   Physical Exam:  Vital  signs: BP 120/88   Pulse 88   Ht 5' 8.5" (1.74 m)   Wt 276 lb 12.8 oz (125.6 kg)   BMI 41.48 kg/m   Constitutional:   Pleasant Overweight Male appears to be in NAD, Well developed, Well nourished, alert and cooperative Head:  Normocephalic and atraumatic. Eyes:   PEERL, EOMI. No icterus. Conjunctiva pink. Ears:  Normal auditory acuity. Neck:  Supple Throat: Oral cavity and pharynx without inflammation, swelling or lesion.  Respiratory: Respirations even and unlabored. Lungs clear to auscultation bilaterally.   No wheezes, crackles, or rhonchi.  Cardiovascular: Normal S1, S2. No MRG. Regular rate and rhythm. No peripheral edema, cyanosis or pallor.  Gastrointestinal:  Soft, nondistended, nontender. No rebound or guarding. Normal bowel sounds. No appreciable masses or hepatomegaly. Rectal:  Not performed.  Msk:  Symmetrical without gross deformities. Without edema, no deformity or joint abnormality.  Neurologic:  Alert and  oriented x4;  grossly normal neurologically.  Skin:   Dry and intact without significant lesions or rashes. Psychiatric: Demonstrates good judgement and reason without abnormal affect or behaviors.  RELEVANT LABS AND IMAGING: CBC    Component Value Date/Time   WBC 6.5 12/19/2016 1416   RBC 5.52 12/19/2016 1416   HGB 15.7 12/19/2016 1416   HCT 45.3 12/19/2016 1416   PLT 347 12/19/2016 1416   MCV 82.1 12/19/2016 1416   MCH 28.4 12/19/2016 1416   MCHC 34.7 12/19/2016 1416   RDW 12.7 12/19/2016 1416   LYMPHSABS 2.5 09/26/2016 1206   MONOABS 0.7 09/26/2016 1206   EOSABS 0.3 09/26/2016 1206   BASOSABS 0.1 09/26/2016 1206    CMP     Component Value Date/Time   NA 137 12/19/2016 1416   K 4.0 12/19/2016 1416   CL 102 12/19/2016 1416   CO2 25 12/19/2016 1416   GLUCOSE 103 (H) 12/19/2016 1416   BUN 10 12/19/2016 1416   CREATININE 1.18 12/19/2016 1416   CALCIUM 9.6 12/19/2016 1416   PROT 7.7 12/19/2016 1416   ALBUMIN 3.9 12/19/2016 1416   AST 28 12/19/2016  1416   ALT 28 12/19/2016 1416   ALKPHOS 59 12/19/2016 1416   BILITOT 0.5 12/19/2016 1416   GFRNONAA >60 12/19/2016 1416   GFRAA >60 12/19/2016 1416    Assessment: 1. Epigastric pain: Started at the beginning of December, recently seen in the ED and diagnosed with H. pylori, started on triple therapy with clarithromycin and amoxicillin as well as Protonix around the second of this month, doing some better; likely epigastric pain is a combination of H. pylori and NSAID use causing gastritis/PUD/ duodenitis 2. H. pylori: See above  Plan: 1. After some discussion with the patient he is worried that there could be something else going on other than this bacteria. I  recommend that we go ahead and schedule an EGD for 5-6 weeks from now and if he continues with epigastric pain, at that point we can proceed. Discussed risks, benefits, limitations alternatives and the patient agrees. This is scheduled with Dr. Havery Moros as he is the supervising physician this morning,  2. Recommend the patient finish his complete antibiotic regimen and increase his Pantoprazole 40 mg to  twice daily. He should remain on his Pantoprazole for a total of another month, did provide him with 60 pills and one refill 3. Reviewed antireflux diet and lifestyle modifications. Provide the patient with a handout 4. Recommend patient use Tylenol in the future 5. Patient to return to clinic per Dr. Doyne Keel recommendations after time of procedure  Brian Newer, PA-C [facility] 01/02/2017, 9:11 AM

## 2017-01-02 NOTE — Patient Instructions (Signed)
You have been scheduled for an endoscopy. Please follow written instructions given to you at your visit today. If you use inhalers (even only as needed), please bring them with you on the day of your procedure. Your physician has requested that you go to www.startemmi.com and enter the access code given to you at your visit today. This web site gives a general overview about your procedure. However, you should still follow specific instructions given to you by our office regarding your preparation for the procedure.  We have sent the following medications to your pharmacy for you to pick up at your convenience: Pantoprazole 40 mg twice a day 30-60 mins before breakfast and dinner.   Finish antibiotic for h. pylori.

## 2017-01-03 NOTE — Progress Notes (Signed)
Agree with assessment and plan. If pain persists will proceed with EGD. If EGD is not done and he is feeling better, would recommend stool based testing for H pylori to ensure appropriate eradication after therapy.

## 2017-02-15 ENCOUNTER — Ambulatory Visit (AMBULATORY_SURGERY_CENTER): Payer: Medicaid Other | Admitting: Gastroenterology

## 2017-02-15 ENCOUNTER — Encounter: Payer: Self-pay | Admitting: Gastroenterology

## 2017-02-15 VITALS — BP 114/78 | HR 87 | Temp 96.6°F | Resp 26 | Ht 68.0 in | Wt 276.0 lb

## 2017-02-15 DIAGNOSIS — R1013 Epigastric pain: Secondary | ICD-10-CM | POA: Diagnosis not present

## 2017-02-15 DIAGNOSIS — K5900 Constipation, unspecified: Secondary | ICD-10-CM

## 2017-02-15 DIAGNOSIS — K2950 Unspecified chronic gastritis without bleeding: Secondary | ICD-10-CM | POA: Diagnosis not present

## 2017-02-15 DIAGNOSIS — K5909 Other constipation: Secondary | ICD-10-CM

## 2017-02-15 MED ORDER — PANTOPRAZOLE SODIUM 40 MG PO TBEC: 40.0000 mg | DELAYED_RELEASE_TABLET | Freq: Two times a day (BID) | ORAL | 1 refills | Status: DC

## 2017-02-15 MED ORDER — SODIUM CHLORIDE 0.9 % IV SOLN: 500.0000 mL | INTRAVENOUS | Status: DC

## 2017-02-15 MED ORDER — POLYETHYLENE GLYCOL 3350 17 GM/SCOOP PO POWD: ORAL | 1 refills | Status: DC

## 2017-02-15 NOTE — Op Note (Signed)
[place] Patient Name: Brian Diaz Procedure Date: 02/15/2017 9:53 AM MRN: [id number] Endoscopist: Remo Lipps P. Armbruster MD, MD Age: 41 Referring MD:  Date of Birth: 1976/01/08 Gender: Male Account #: [id number] Procedure:                Upper GI endoscopy Indications:              Epigastric abdominal pain, history of Helicobacter                            pylori s/p therapy, symptoms persist despite                            pantoprazole twice daily. Patient also now endorses                            constipation adn lower abdominal discomfort Medicines:                Monitored Anesthesia Care Procedure:                Pre-Anesthesia Assessment:                           - Prior to the procedure, a History and Physical                            was performed, and patient medications and                            allergies were reviewed. The patient's tolerance of                            previous anesthesia was also reviewed. The risks                            and benefits of the procedure and the sedation                            options and risks were discussed with the patient.                            All questions were answered, and informed consent                            was obtained. Prior Anticoagulants: The patient has                            taken no previous anticoagulant or antiplatelet                            agents. ASA Grade Assessment: II - A patient with                            mild systemic disease. After reviewing the risks  and benefits, the patient was deemed in                            satisfactory condition to undergo the procedure.                           After obtaining informed consent, the endoscope was                            passed under direct vision. Throughout the                            procedure, the patient's blood pressure, pulse, and                            oxygen  saturations were monitored continuously. The                            Model GIF-[id number]) scope was introduced                            through the mouth, and advanced to the second part                            of duodenum. The upper GI endoscopy was                            accomplished without difficulty. The patient                            tolerated the procedure well. Scope In: Scope Out: Findings:                 Esophagogastric landmarks were identified: the                            Z-line was found at 37 cm, the gastroesophageal                            junction was found at 37 cm and the upper extent of                            the gastric folds was found at 37 cm from the                            incisors.                           The exam of the esophagus was otherwise normal.                           Patchy mildly erythematous mucosa was found in the                            gastric  body. Biopsies were taken with a cold                            forceps for Helicobacter pylori testing from the                            antrum and body.                           The exam of the stomach was otherwise normal.                           The duodenal bulb and second portion of the                            duodenum were normal. Ampulla was visualized and                            appeared normal. Complications:            No immediate complications. Estimated blood loss:                            Minimal. Estimated Blood Loss:     Estimated blood loss was minimal. Impression:               - Esophagogastric landmarks identified.                           - Normal esophagus                           - Mildly erythematous mucosa in the gastric body.                            Biopsied to test for H pylori eradication. No ulcers                           - Normal stomach otherwise                           - Normal duodenal bulb and second portion  of the                            duodenum. Recommendation:           - Patient has a contact number available for                            emergencies. The signs and symptoms of potential                            delayed complications were discussed with the                            patient. Return to normal activities tomorrow.  Written discharge instructions were provided to the                            patient.                           - Resume previous diet.                           - Continue present medications.                           - Await pathology results.                           - Start Miralax 17gm twice daily for constipation                           - If symptoms persist and pathology results                            negative, consider cross sectional imaging Steven P. Armbruster MD, MD 02/15/2017 10:16:08 AM This report has been signed electronically.

## 2017-02-15 NOTE — Patient Instructions (Signed)
Discharge instructions given. Biopsies taken. Resume previous medications. YOU HAD AN ENDOSCOPIC PROCEDURE TODAY AT THE [facility]:   Refer to the procedure report that was given to you for any specific questions about what was found during the examination.  If the procedure report does not answer your questions, please call your gastroenterologist to clarify.  If you requested that your care partner not be given the details of your procedure findings, then the procedure report has been included in a sealed envelope for you to review at your convenience later.  YOU SHOULD EXPECT: Some feelings of bloating in the abdomen. Passage of more gas than usual.  Walking can help get rid of the air that was put into your GI tract during the procedure and reduce the bloating. If you had a lower endoscopy (such as a colonoscopy or flexible sigmoidoscopy) you may notice spotting of blood in your stool or on the toilet paper. If you underwent a bowel prep for your procedure, you may not have a normal bowel movement for a few days.  Please Note:  You might notice some irritation and congestion in your nose or some drainage.  This is from the oxygen used during your procedure.  There is no need for concern and it should clear up in a day or so.  SYMPTOMS TO REPORT IMMEDIATELY:   Following upper endoscopy (EGD)  Vomiting of blood or coffee ground material  New chest pain or pain under the shoulder blades  Painful or persistently difficult swallowing  New shortness of breath  Fever of 100F or higher  Black, tarry-looking stools  For urgent or emergent issues, a gastroenterologist can be reached at any hour by calling [phone].   DIET:  We do recommend a small meal at first, but then you may proceed to your regular diet.  Drink plenty of fluids but you should avoid alcoholic beverages for 24 hours.  ACTIVITY:  You should plan to take it easy for the rest of today and you should NOT DRIVE  or use heavy machinery until tomorrow (because of the sedation medicines used during the test).    FOLLOW UP: Our staff will call the number listed on your records the next business day following your procedure to check on you and address any questions or concerns that you may have regarding the information given to you following your procedure. If we do not reach you, we will leave a message.  However, if you are feeling well and you are not experiencing any problems, there is no need to return our call.  We will assume that you have returned to your regular daily activities without incident.  If any biopsies were taken you will be contacted by phone or by letter within the next 1-3 weeks.  Please call us at [phone] if you have not heard about the biopsies in 3 weeks.    SIGNATURES/CONFIDENTIALITY: You and/or your care partner have signed paperwork which will be entered into your electronic medical record.  These signatures attest to the fact that that the information above on your After Visit Summary has been reviewed and is understood.  Full responsibility of the confidentiality of this discharge information lies with you and/or your care-partner. 

## 2017-02-15 NOTE — Progress Notes (Signed)
To recovery vss report to [employer]

## 2017-02-15 NOTE — Progress Notes (Signed)
Called to room to assist during endoscopic procedure.  Patient ID and intended procedure confirmed with present staff. Received instructions for my participation in the procedure from the performing physician.  

## 2017-02-16 ENCOUNTER — Telehealth: Payer: Self-pay | Admitting: *Deleted

## 2017-02-16 NOTE — Telephone Encounter (Signed)
  Follow up Call-  Call back number 02/15/2017  Post procedure Call Back phone  # [phone]  Permission to leave phone message Yes  Some recent data might be hidden     Patient questions:  Do you have a fever, pain , or abdominal swelling? yes Pain Score  5 Patient has abdominal pain that has been ongoing prior to the procedure with no increase of worsening since the EGD yesterday. Instructed patient to call with any change in that or questions or concerns. SM  Have you tolerated food without any problems? Yes   Have you been able to return to your normal activities? Yes.    Do you have any questions about your discharge instructions: Diet   No. Medications  No. Follow up visit  No.  Do you have questions or concerns about your Care? No.  Actions: * If pain score is 4 or above: No action needed, pain <4.

## 2017-02-27 ENCOUNTER — Telehealth: Payer: Self-pay

## 2017-02-27 ENCOUNTER — Other Ambulatory Visit: Payer: Self-pay

## 2017-02-27 DIAGNOSIS — R1011 Right upper quadrant pain: Secondary | ICD-10-CM

## 2017-02-27 NOTE — Telephone Encounter (Signed)
error 

## 2017-03-05 ENCOUNTER — Other Ambulatory Visit: Payer: Self-pay

## 2017-03-05 DIAGNOSIS — R1011 Right upper quadrant pain: Secondary | ICD-10-CM

## 2017-03-08 ENCOUNTER — Ambulatory Visit (HOSPITAL_COMMUNITY)
Admission: RE | Admit: 2017-03-08 | Discharge: 2017-03-08 | Disposition: A | Discharge status: Home / Self Care | Payer: Medicaid Other | Source: Ambulatory Visit | Attending: Gastroenterology | Admitting: Gastroenterology

## 2017-03-08 DIAGNOSIS — R1011 Right upper quadrant pain: Secondary | ICD-10-CM | POA: Diagnosis present

## 2017-03-08 DIAGNOSIS — K760 Fatty (change of) liver, not elsewhere classified: Secondary | ICD-10-CM | POA: Diagnosis not present

## 2017-03-08 DIAGNOSIS — K859 Acute pancreatitis without necrosis or infection, unspecified: Secondary | ICD-10-CM | POA: Diagnosis not present

## 2017-03-08 MED ORDER — IOPAMIDOL (ISOVUE-300) INJECTION 61%
100.0000 mL | Freq: Once | INTRAVENOUS | Status: AC | PRN
  Administered 2017-03-08: 100 mL via INTRAVENOUS

## 2017-03-08 MED ORDER — IOPAMIDOL (ISOVUE-300) INJECTION 61%
INTRAVENOUS | Status: AC
  Filled 2017-03-08: qty 100

## 2017-03-13 ENCOUNTER — Other Ambulatory Visit: Payer: Self-pay

## 2017-03-13 DIAGNOSIS — K859 Acute pancreatitis without necrosis or infection, unspecified: Secondary | ICD-10-CM

## 2017-03-14 ENCOUNTER — Other Ambulatory Visit: Payer: Self-pay

## 2017-03-15 ENCOUNTER — Other Ambulatory Visit (INDEPENDENT_AMBULATORY_CARE_PROVIDER_SITE_OTHER): Payer: Medicaid Other

## 2017-03-15 ENCOUNTER — Other Ambulatory Visit: Payer: Self-pay | Admitting: Gastroenterology

## 2017-03-15 DIAGNOSIS — R739 Hyperglycemia, unspecified: Secondary | ICD-10-CM

## 2017-03-15 DIAGNOSIS — K859 Acute pancreatitis without necrosis or infection, unspecified: Secondary | ICD-10-CM | POA: Diagnosis not present

## 2017-03-15 DIAGNOSIS — R935 Abnormal findings on diagnostic imaging of other abdominal regions, including retroperitoneum: Secondary | ICD-10-CM

## 2017-03-15 LAB — CBC WITH DIFFERENTIAL/PLATELET
Basophils Absolute: 0.1 10*3/uL (ref 0.0–0.1)
Basophils Relative: 1.9 % (ref 0.0–3.0)
EOS ABS: 0.3 10*3/uL (ref 0.0–0.7)
Eosinophils Relative: 5.6 % — ABNORMAL HIGH (ref 0.0–5.0)
HCT: 43.5 % (ref 39.0–52.0)
HEMOGLOBIN: 14.5 g/dL (ref 13.0–17.0)
Lymphocytes Relative: 39.1 % (ref 12.0–46.0)
Lymphs Abs: 2.1 10*3/uL (ref 0.7–4.0)
MCHC: 33.4 g/dL (ref 30.0–36.0)
MCV: 81.3 fl (ref 78.0–100.0)
MONO ABS: 0.5 10*3/uL (ref 0.1–1.0)
Monocytes Relative: 9.8 % (ref 3.0–12.0)
NEUTROS PCT: 43.6 % (ref 43.0–77.0)
Neutro Abs: 2.3 10*3/uL (ref 1.4–7.7)
Platelets: 234 10*3/uL (ref 150.0–400.0)
RBC: 5.35 Mil/uL (ref 4.22–5.81)
RDW: 13.1 % (ref 11.5–15.5)
WBC: 5.3 10*3/uL (ref 4.0–10.5)

## 2017-03-15 LAB — COMPREHENSIVE METABOLIC PANEL
ALT: 24 U/L (ref 0–53)
AST: 15 U/L (ref 0–37)
Albumin: 4 g/dL (ref 3.5–5.2)
Alkaline Phosphatase: 91 U/L (ref 39–117)
BUN: 8 mg/dL (ref 6–23)
CHLORIDE: 99 meq/L (ref 96–112)
CO2: 25 mEq/L (ref 19–32)
CREATININE: 0.87 mg/dL (ref 0.40–1.50)
Calcium: 9.4 mg/dL (ref 8.4–10.5)
GFR: 124.52 mL/min (ref >60.00)
GLUCOSE: 342 mg/dL — AB (ref 70–99)
Potassium: 3.8 mEq/L (ref 3.5–5.1)
SODIUM: 133 meq/L — AB (ref 135–145)
TOTAL PROTEIN: 7.5 g/dL (ref 6.0–8.3)
Total Bilirubin: 0.5 mg/dL (ref 0.2–1.2)

## 2017-03-15 LAB — LIPASE: LIPASE: 84 U/L — AB (ref 11.0–59.0)

## 2017-03-16 ENCOUNTER — Other Ambulatory Visit: Payer: Self-pay

## 2017-03-16 DIAGNOSIS — R748 Abnormal levels of other serum enzymes: Secondary | ICD-10-CM

## 2017-03-16 DIAGNOSIS — R7309 Other abnormal glucose: Secondary | ICD-10-CM

## 2017-03-16 DIAGNOSIS — R1033 Periumbilical pain: Secondary | ICD-10-CM

## 2017-03-16 LAB — ALLERGEN, CORN, IGG4: ALLERGEN, CORN, IGG4: 0.33 ug/mL — AB (ref <0.15)

## 2017-03-19 ENCOUNTER — Telehealth: Payer: Self-pay | Admitting: Gastroenterology

## 2017-03-19 ENCOUNTER — Other Ambulatory Visit (INDEPENDENT_AMBULATORY_CARE_PROVIDER_SITE_OTHER): Payer: Medicaid Other

## 2017-03-19 DIAGNOSIS — R739 Hyperglycemia, unspecified: Secondary | ICD-10-CM

## 2017-03-19 DIAGNOSIS — R935 Abnormal findings on diagnostic imaging of other abdominal regions, including retroperitoneum: Secondary | ICD-10-CM

## 2017-03-19 LAB — LIPID PANEL
Cholesterol: 218 mg/dL — ABNORMAL HIGH (ref 0–200)
HDL: 21.9 mg/dL — AB (ref >39.00)
LDL Cholesterol: 163 mg/dL — ABNORMAL HIGH (ref 0–99)
NONHDL: 196.53
TRIGLYCERIDES: 167 mg/dL — AB (ref 0.0–149.0)
Total CHOL/HDL Ratio: 10
VLDL: 33.4 mg/dL (ref 0.0–40.0)

## 2017-03-19 LAB — HEMOGLOBIN A1C: HEMOGLOBIN A1C: 9.5 % — AB (ref 4.6–6.5)

## 2017-03-19 LAB — GLUCOSE, RANDOM: GLUCOSE: 248 mg/dL — AB (ref 70–99)

## 2017-03-19 NOTE — Telephone Encounter (Signed)
Patient calling back about lab results from today 03/19/17

## 2017-03-20 ENCOUNTER — Other Ambulatory Visit: Payer: Self-pay

## 2017-03-20 DIAGNOSIS — R7309 Other abnormal glucose: Secondary | ICD-10-CM

## 2017-03-20 DIAGNOSIS — R748 Abnormal levels of other serum enzymes: Secondary | ICD-10-CM

## 2017-03-20 DIAGNOSIS — R109 Unspecified abdominal pain: Secondary | ICD-10-CM

## 2017-03-20 LAB — IGG 4: IGG 4: 45 mg/dL (ref 2–96)

## 2017-03-20 MED ORDER — METFORMIN HCL 1000 MG PO TABS: 1000.0000 mg | ORAL_TABLET | Freq: Two times a day (BID) | ORAL | 3 refills | Status: DC

## 2017-03-20 MED ORDER — METFORMIN HCL 500 MG PO TABS: 500.0000 mg | ORAL_TABLET | Freq: Two times a day (BID) | ORAL | 0 refills | Status: DC

## 2017-03-21 ENCOUNTER — Ambulatory Visit (HOSPITAL_COMMUNITY): Payer: Medicaid Other

## 2017-03-21 ENCOUNTER — Ambulatory Visit (HOSPITAL_COMMUNITY): Admission: RE | Admit: 2017-03-21 | Payer: Medicaid Other | Source: Ambulatory Visit

## 2017-04-02 ENCOUNTER — Telehealth: Payer: Self-pay | Admitting: Gastroenterology

## 2017-04-02 NOTE — Telephone Encounter (Signed)
Called patient to check on him. I had recommended a follow up MRCP in regards to his CT scan findings which his insurance company has declined and won't allow, despite an appeal.   I left a message and will call him back tomorrow.

## 2017-04-03 NOTE — Telephone Encounter (Signed)
Called again and no answer. Will call later

## 2017-04-05 NOTE — Telephone Encounter (Signed)
Patient has Medicaid, so he is correct they do not accept this insurance. I did call three different practices and the only one accepting new Medicaid patient's is [facility]. We need to call and make the referral as they would not accept him if he just called. I will call patient to let him know, if this is okay with you to make this referral.

## 2017-04-05 NOTE — Telephone Encounter (Signed)
Yes that would be fine. Thanks so much for helping with this.

## 2017-04-05 NOTE — Telephone Encounter (Signed)
Called patient and finally got ahold of him.  He's feeling better since starting metformin 1gm BID for his diabetes. I think this could have been driving some of his symptoms. His abdominal pain has completely resolved. Unclear if he previously had pancreatitis which was healing and caught on the CT we did it (lipase was barely elevated when drawn, but his chronic symptoms were not c/w pancreatitis). His insurance denied MRCP but I do think he warrant some sort of follow up imaging of the pancreas at some point in time.  Almyra Free can you book him a follow up with me in 6-8 weeks? He's also asking to switch his primary care to [facility] [place] at our building. He called and was told they don't take Medicare patients. Is this true? Would you be able to clarify this as he currently does not have a primary care and needs a follow up visit for diabetes and further management of this. Thanks

## 2017-04-06 NOTE — Telephone Encounter (Signed)
Spoke to patient, he is scheduled to see Dr. Rosalyn Gess at [facility] on 4/30 at 1:30. He will look for mailed information from their office to complete and bring with him to appointment. He has follow up visit here on 5/30 at 9:00.

## 2017-04-23 ENCOUNTER — Ambulatory Visit (INDEPENDENT_AMBULATORY_CARE_PROVIDER_SITE_OTHER): Payer: Medicaid Other | Admitting: Family Medicine

## 2017-04-23 ENCOUNTER — Encounter: Payer: Self-pay | Admitting: Family Medicine

## 2017-04-23 VITALS — BP 108/72 | HR 72 | Temp 98.6°F | Ht 68.0 in | Wt 247.8 lb

## 2017-04-23 DIAGNOSIS — M7521 Bicipital tendinitis, right shoulder: Secondary | ICD-10-CM

## 2017-04-23 DIAGNOSIS — E785 Hyperlipidemia, unspecified: Secondary | ICD-10-CM

## 2017-04-23 DIAGNOSIS — E119 Type 2 diabetes mellitus without complications: Secondary | ICD-10-CM

## 2017-04-23 LAB — GLUCOSE, POCT (MANUAL RESULT ENTRY): POC GLUCOSE: 276 mg/dL — AB (ref 70–99)

## 2017-04-23 LAB — POCT UA - MICROALBUMIN
CREATININE, POC: 100 mg/dL
MICROALBUMIN (UR) POC: 150 mg/L

## 2017-04-23 MED ORDER — ATORVASTATIN CALCIUM 20 MG PO TABS: 20.0000 mg | ORAL_TABLET | Freq: Every day | ORAL | 3 refills | Status: DC

## 2017-04-23 NOTE — Assessment & Plan Note (Addendum)
New diagnosis of type 2 diabetes in March 2018 and started on metformin 1000 mg twice a day. Denies any polyuria, polydipsia, nocturia, patient changes or numbness or tingling in his extremities. Reports that he is compliant with his medications. Does report some loose stools, but would rather continue with medication for couple more months to see symptoms resolve.  -Point-of-care CBG today -Follow-up A1c in 2 months -Continue metformin 1000mg  BID -Recommended exercise and diet changes

## 2017-04-23 NOTE — Patient Instructions (Addendum)
You were seen today in the office for a new visit. We discussed your diagnosis of diabetes and your current side effects.  You have decided to continue with the metformin for now until you follow up in two months. Also, we discussed your cholesterol and you have decided to hold off on the medication until your follow up appointment   Please follow up in two months.  It was very nice meeting you. Brian Diaz L. Rosalyn Gess, [place] Resident PGY-1 04/23/2017 2:58 PM      Biceps Tendon Tendinitis (Proximal) and Tenosynovitis The proximal biceps tendon is a strong cord of tissue that connects the biceps muscle, on the front of the upper arm, to the shoulder blade. Tendinitis is inflammation of a tendon. Tenosynovitis is inflammation of the lining around the tendon (tendon sheath). These conditions often occur at the same time, and they can interfere with the ability to bend the elbow and turn the hand palm-up (supination). Proximal biceps tendon tendinitis and tenosynovitis are usually caused by overusing the shoulder joint and the biceps muscle. These conditions usually heal within 6 weeks. Proximal biceps tendon tendinitis may include a grade 1 or grade 2 strain of the tendon. A grade 1 strain is mild, and it involves a slight pull of the tendon without any stretching or noticeable tearing of the tendon. There is usually no loss of biceps muscle strength. A grade 2 strain is moderate, and it involves a small tear in the tendon. The tendon is stretched, and biceps strength is usually decreased. What are the causes? This condition may be caused by:  A sudden increase in frequency or intensity of activity that involves the shoulder and the biceps muscle.  Overuse of the biceps muscle. This can happen when you do the same movements over and over, such as:  Supination.  Forceful straightening (hyperextension) of the elbow.  Bending the elbow.  A direct, forceful hit or injury  (trauma) to the elbow. This is rare. What increases the risk? The following factors may make you more likely to develop this condition:  Playing contact sports.  Playing sports that involve throwing and overhead movements, including racket sports, gymnastics, weight lifting, or bodybuilding.  Doing physical labor.  Having poor strength and flexibility of the arm and shoulder. What are the signs or symptoms? Symptoms of this condition may include:  Pain and inflammation in the front of the shoulder. Pain may get worse with movement, especially when you use resistance, as in weight lifting.  A feeling of warmth in the front of the shoulder.  Limited range of motion of the shoulder and the elbow.  A crackling sound (crepitation) when you move or touch the shoulder or the upper arm. In some cases, symptoms may return (recur) after treatment, and they may be long-lasting (chronic). How is this diagnosed? This condition is diagnosed based on your symptoms, your medical history, and a physical exam. You may have tests, including X-rays or MRIs. Your health care provider may test your range of motion by asking you to do arm movements. How is this treated? This condition is treated by resting and icing the injured area, and by doing physical therapy exercises. Depending on the severity of your condition, treatment may also include:  Medicines   to help relieve pain and inflammation.  Ultrasound therapy. This is the application of sound waves to the injured area.  Injecting medicines (corticosteroids) into your tendon sheath.  Injecting medicines that numb the area (local  anesthetics).  Surgery to remove the damaged part of the tendon and reattach the undamaged part of the tendon to the arm bone (humerus). This is usually only done if you have symptoms that do not get better with other treatment methods. Follow these instructions at home: Managing pain, stiffness, and swelling   If  directed, put ice on the injured area:  Put ice in a plastic bag.  Place a towel between your skin and the bag.  Leave the ice on for 20 minutes, 2-3 times a day.  Move your fingers often to avoid stiffness and to lessen swelling.  Raise (elevate) the injured area above the level of your heart while you are sitting or lying down.  If directed, apply heat to the affected area before you exercise. Use the heat source that your health care provider recommends, such as a moist heat pack or a heating pad.  Place a towel between your skin and the heat source.  Leave the heat on for 20-30 minutes.  Remove the heat if your skin turns bright red. This is especially important if you are unable to feel pain, heat, or cold. You may have a greater risk of getting burned. Activity   Return to your normal activities as told by your health care provider. Ask your health care provider what activities are safe for you.  Do not lift anything that is heavier than 10 lb (4.5 kg) until your health care provider tells you that it is safe.  Avoid activities that cause pain or make your condition worse.  Do exercises as told by your health care provider. General instructions   Take over-the-counter and prescription medicines only as told by your health care provider.  Do not drive or operate heavy machinery while taking prescription pain medicines.  Keep all follow-up visits as told by your health care provider. This is important. How is this prevented?  Warm up and stretch before being active.  Cool down and stretch after being active.  Give your body time to rest between periods of activity.  Make sure any equipment that you use is fitted to you.  Be safe and responsible while being active to avoid falls.  Do at least 150 minutes of moderate-intensity aerobic exercise each week, such as brisk walking or water aerobics.  Maintain physical fitness,  including:  Strength.  Flexibility.  Cardiovascular fitness.  Endurance. Contact a health care provider if:  You have symptoms that get worse or do not get better after 2 weeks of treatment.  You develop new symptoms. Get help right away if:  You develop severe pain. This information is not intended to replace advice given to you by your health care provider. Make sure you discuss any questions you have with your health care provider. Document Released: 12/11/2005 Document Revised: 08/17/2016 Document Reviewed: 11/19/2015 Elsevier Interactive Patient Education  2017 [employer].

## 2017-04-23 NOTE — Progress Notes (Signed)
Subjective:  Brian Diaz is a 41 y.o. male who presents to the [facility] today to establish care  HPI:  Brian Diaz is a 41 year old male here today to establish care. He is a history of type 2 diabetes with most recent hemoglobin A1c 9.5 on 03/19/2017. He takes metformin 1000 mg twice a day daily. States that he is compliant with his medication. See in the emergency department on 12/19/2016 for epigastric pain in the right upper quadrant which prompted further workup and diagnosis of H. pylori infection. He completed triple therapy and had gastroneurology follow-up and symptoms persisted and ultimately had an EGD that was unremarkable. He is to follow-up with gastroenterology within the next month.  Currently denies any nausea vomiting, diarrhea, fevers, chills or dysuria. With regards to his diabetes he denies any changes in vision, increased thirst and increased urination or numbness or tingling in his extremities.   He's currently in a relationship with a woman and is currently unemployed and his highest level of education was some college. Does not exercise regularly. Denies any drug use, alcohol use or tobacco use. He denies having little interest or pleasure in doing things and also denies feeling down or depressed.    PMH: Hyperlipidemia and type 2 diabetes Tobacco use: Never smoked Medication: reviewed and updated ROS: see HPI   Objective:  Physical Exam: BP 108/72 (BP Location: Left Arm, Patient Position: Sitting, Cuff Size: Large)   Pulse 72   Temp 98.6 F (37 C) (Oral)   Ht 5\' 8"  (1.727 m)   Wt 112.4 kg (247 lb 12.8 oz)   SpO2 98%   BMI 37.68 kg/m   Gen: 41 year old male in NAD, resting comfortably CV: RRR with no murmurs appreciated Pulm: NWOB, CTAB with no crackles, wheezes, or rhonchi GI: Normal bowel sounds present. Soft, Nontender, Nondistended. MSK: no edema, cyanosis, or clubbing noted, FROM, but TTP on R proximal biceps tendon with +neers and  hawkins Skin: warm, dry Neuro: grossly normal, moves all extremities Psych: Normal affect and thought content  Results for orders placed or performed in visit on 04/23/17 (from the past 72 hour(s))  POCT UA - Microalbumin     Status: Abnormal   Collection Time: 04/23/17  1:40 PM  Result Value Ref Range   Microalbumin Ur, POC 150 mg/L   Creatinine, POC 100 mg/dL   Albumin/Creatinine Ratio, Urine, POC 30-300   POCT glucose (manual entry)     Status: Abnormal   Collection Time: 04/23/17  1:46 PM  Result Value Ref Range   POC Glucose 276 (A) 70 - 99 mg/dl     Assessment/Plan:  Type 2 diabetes mellitus ([place]) New diagnosis of type 2 diabetes in March 2018 and started on metformin 1000 mg twice a day. Denies any polyuria, polydipsia, nocturia, patient changes or numbness or tingling in his extremities. Reports that he is compliant with his medications. Does report some loose stools, but would rather continue with medication for couple more months to see symptoms resolve.  -Point-of-care CBG today -Follow-up A1c in 2 months -Continue metformin 1000mg  BID -Recommended exercise and diet changes  Hyperlipemia Type 2 diabetic with hyperlipidemia and ASCVD risk of >9.7% recommended for high-intensity statin. Prescribed mod-intensity statin to start due to patient concerns about side effects. Called patient to encourage 40mg  daily and he agrees.  -lipitor 40mg  daily  Biceps tendonitis on right FROM with TTP on proximal biceps tendon on R side with positive neers and hawkins.  -recommended ice, tylenol and provided  patient with handout demonstrating exercises -will reassess in 82mo at diabetes follow up. Could consider steroid injection at that time

## 2017-04-24 DIAGNOSIS — E785 Hyperlipidemia, unspecified: Secondary | ICD-10-CM | POA: Insufficient documentation

## 2017-04-24 DIAGNOSIS — M7581 Other shoulder lesions, right shoulder: Secondary | ICD-10-CM | POA: Insufficient documentation

## 2017-04-24 MED ORDER — ATORVASTATIN CALCIUM 20 MG PO TABS: 40.0000 mg | ORAL_TABLET | Freq: Every day | ORAL | 3 refills | Status: AC

## 2017-04-24 NOTE — Assessment & Plan Note (Addendum)
Type 2 diabetic with hyperlipidemia and ASCVD risk of >9.7% recommended for high-intensity statin. Prescribed mod-intensity statin to start due to patient concerns about side effects. Called patient to encourage 40mg  daily and he agrees.  -lipitor 40mg  daily

## 2017-04-24 NOTE — Assessment & Plan Note (Signed)
FROM with TTP on proximal biceps tendon on R side with positive neers and hawkins.  -recommended ice, tylenol and provided patient with handout demonstrating exercises -will reassess in 33mo at diabetes follow up. Could consider steroid injection at that time

## 2017-05-23 ENCOUNTER — Encounter: Payer: Self-pay | Admitting: Gastroenterology

## 2017-05-23 ENCOUNTER — Ambulatory Visit (INDEPENDENT_AMBULATORY_CARE_PROVIDER_SITE_OTHER): Payer: Medicaid Other | Admitting: Gastroenterology

## 2017-05-23 ENCOUNTER — Other Ambulatory Visit (INDEPENDENT_AMBULATORY_CARE_PROVIDER_SITE_OTHER): Payer: Medicaid Other

## 2017-05-23 VITALS — BP 100/76 | HR 80 | Ht 68.0 in | Wt 238.0 lb

## 2017-05-23 DIAGNOSIS — R935 Abnormal findings on diagnostic imaging of other abdominal regions, including retroperitoneum: Secondary | ICD-10-CM

## 2017-05-23 DIAGNOSIS — K760 Fatty (change of) liver, not elsewhere classified: Secondary | ICD-10-CM

## 2017-05-23 DIAGNOSIS — R634 Abnormal weight loss: Secondary | ICD-10-CM

## 2017-05-23 LAB — BASIC METABOLIC PANEL
BUN: 5 mg/dL — AB (ref 6–23)
CO2: 29 mEq/L (ref 19–32)
CREATININE: 0.96 mg/dL (ref 0.40–1.50)
Calcium: 9.5 mg/dL (ref 8.4–10.5)
Chloride: 104 mEq/L (ref 96–112)
GFR: 111.04 mL/min (ref >60.00)
Glucose, Bld: 137 mg/dL — ABNORMAL HIGH (ref 70–99)
Potassium: 3.5 mEq/L (ref 3.5–5.1)
Sodium: 139 mEq/L (ref 135–145)

## 2017-05-23 NOTE — Patient Instructions (Addendum)
  Your physician has requested that you go to the basement for the following lab work before leaving today: BMET   You have been scheduled for a CT scan of the abdomen at [facility] ([address]---this is in the same building as [facility]). Your appointment is 05/31/17 at 10:00AM  You will drink the contrast there.   You should arrive 30 minutes prior to your appointment time for registration. Please follow the written instructions below on the day of your exam:  WARNING: IF YOU ARE ALLERGIC TO IODINE/X-RAY DYE, PLEASE NOTIFY RADIOLOGY IMMEDIATELY AT [phone]! YOU WILL BE GIVEN A 13 HOUR PREMEDICATION PREP.  1) Do not eat after 8:00AM   You may take any medications as prescribed with a small amount of water except for the following: Metformin, Glucophage, Glucovance, Avandamet, Riomet, Fortamet, Actoplus Met, Janumet, Glumetza or Metaglip. The above medications must be held the day of the exam AND 48 hours after the exam.  The purpose of you drinking the oral contrast is to aid in the visualization of your intestinal tract.Before your exam is started, you will be given a small amount of fluid to drink. Depending on your individual set of symptoms, you may also receive an intravenous injection of x-ray contrast/dye. Plan on being at [facility] for 30 minutes or longer, depending on the type of exam you are having performed.  This test typically takes 30-45 minutes to complete.  If you have any questions regarding your exam or if you need to reschedule, you may call the CT department at [phone] between the hours of 8:00 am and 5:00 pm, Monday-Friday.  ________________________________________________________________________   I appreciate the opportunity to care for you.

## 2017-05-23 NOTE — Progress Notes (Signed)
HPI :  41 year old man here for follow-up visit. He was previously seen in January for new patient visit for epigastric pain. At that time he had a positive H. pylori antibody test and was treated for H. Pylori. He subsequently underwent endoscopy.  EGD 02/15/17 - mild gastritis noted, exam otherwise normal. Biopsies negative for HP, it was eradicated  RUQ [place] negative for gallstones  Basic labs done showing normal CBC and LFTs, but glucose randomly noted to be 342. Lipase was mildly elevated to 84.  A1c tested and returned at 9.5. Started on metformin 1000mg  BID and recommended he follow up with primary care CT abdomen showed "mild acute pancreatitis", diffuse hepatic steatosis, although his symptoms of pain were chronic for a few months. IgG4 normal  I had ordered an MRCP given CT findings, to ensure no pancreatic mass lesion in light of his newly diagnosed DM, but his insurance denied it.   Started on miralax for constipation  He reports in general no pain for the past month or so. He report he had an episode of pain mostly in the epigastric area about a month ago but resolved. He is eating okay. No nausea or vomiting. He reports he has had some loose stools since starting metformin and thinks overall doing better on metformin. He estaclished with new PCM for his DM.  Fatty liver noted incidentally on CT scan - no history of liver disease. ALT is 24. No FH of liver disease. No alcohol use since November, previously occasional use, not routine. He has lost some weight 40-50 lbs since last November he thinks due to poor appetite mostly and his pain at the time, and perhaps slightly due to metformin. Pt does not drink coffee.     Past Medical History:  Diagnosis Date  . Diabetes mellitus without complication ([place])   . Fatty liver   . H. pylori infection   . Obesity (BMI 30-39.9)      Past Surgical History:  Procedure Laterality Date  . TOOTH EXTRACTION  2009   Family History    Problem Relation Age of Onset  . Heart disease Mother   . COPD Mother   . Colon cancer Neg Hx   . Stomach cancer Neg Hx   . Esophageal cancer Neg Hx    Social History  Substance Use Topics  . Smoking status: Former Smoker    Quit date: 09/27/2007  . Smokeless tobacco: Never Used  . Alcohol use Yes     Comment: occasional    Current Outpatient Prescriptions  Medication Sig Dispense Refill  . atorvastatin (LIPITOR) 20 MG tablet Take 2 tablets (40 mg total) by mouth daily. 90 tablet 3  . metFORMIN (GLUCOPHAGE) 1000 MG tablet Take 1 tablet (1,000 mg total) by mouth 2 (two) times daily with a meal. 60 tablet 3   Current Facility-Administered Medications  Medication Dose Route Frequency Provider Last Rate Last Dose  . 0.9 %  sodium chloride infusion  500 mL Intravenous Continuous Armbruster, Renelda Loma, MD       No Known Allergies   Review of Systems: All systems reviewed and negative except where noted in HPI.   Lab Results  Component Value Date   WBC 5.3 03/15/2017   HGB 14.5 03/15/2017   HCT 43.5 03/15/2017   MCV 81.3 03/15/2017   PLT 234.0 03/15/2017    Lab Results  Component Value Date   ALT 24 03/15/2017   AST 15 03/15/2017   ALKPHOS 91 03/15/2017  BILITOT 0.5 03/15/2017    Lab Results  Component Value Date   CREATININE 0.96 05/23/2017   BUN 5 (L) 05/23/2017   NA 139 05/23/2017   K 3.5 05/23/2017   CL 104 05/23/2017   CO2 29 05/23/2017     Physical Exam: BP 100/76   Pulse 80   Ht 5\' 8"  (1.727 m)   Wt 238 lb (108 kg)   BMI 36.19 kg/m  Constitutional: Pleasant,well-developed, male in no acute distress. HEENT: Normocephalic and atraumatic. Conjunctivae are normal. No scleral icterus. Neck supple.  Cardiovascular: Normal rate, regular rhythm.  Pulmonary/chest: Effort normal and breath sounds normal. No wheezing, rales or rhonchi. Abdominal: Soft, nondistended/ protuberant, nontender. t. There are no masses palpable. No hepatomegaly. Extremities:  no edema Lymphadenopathy: No cervical adenopathy noted. Neurological: Alert and oriented to person place and time. Skin: Skin is warm and dry. No rashes noted. Psychiatric: Normal mood and affect. Behavior is normal.   ASSESSMENT AND PLAN: 41 year old man here for follow-up visit for history of epigastric pain and abnormal pancreatic imaging.  Initially treated for H. Pylori, pain persisted and follow-up endoscopy did not show clear etiology. No gallstones on imaging. Labs showed mildly elevated lipase (not in pancreatitis range) and newly diagnosed DM with A1c in 9s. CT showed mild diffuse pancreatic edema, suspicious for "mild acute pancreatitis", but his symptoms were not c/w acute pancreatitis. IgG4 level normal.   Given his weight loss, newly diagnosed diabetes, and CT scan result, I recommended an MRCP to further evaluate his pancreas, ensure no mass lesion, but this was denied by his insurance. I do feel like he warrants follow-up pancreatic imaging given he continues to lose weight. I've order a CT the pancreas in this light, hopefully this does get approved. His pain otherwise is improved.  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 Discussed long term risks of fatty liver to include NASH/cirrhosis. His LFTs are normal. He is already losing weight. Routine coffee intake can prevent fibrotic changes. Recommend yearly LFTs over time to monitor.   He agreed with plan, will let him know imaging results.  Underwood Cellar, MD [facility] Gastroenterology Pager [phone]

## 2017-05-31 ENCOUNTER — Ambulatory Visit (INDEPENDENT_AMBULATORY_CARE_PROVIDER_SITE_OTHER)
Admission: RE | Admit: 2017-05-31 | Discharge: 2017-05-31 | Disposition: A | Discharge status: Home / Self Care | Payer: Medicaid Other | Source: Ambulatory Visit | Attending: Gastroenterology | Admitting: Gastroenterology

## 2017-05-31 DIAGNOSIS — R634 Abnormal weight loss: Secondary | ICD-10-CM

## 2017-05-31 DIAGNOSIS — R935 Abnormal findings on diagnostic imaging of other abdominal regions, including retroperitoneum: Secondary | ICD-10-CM | POA: Diagnosis not present

## 2017-05-31 DIAGNOSIS — K760 Fatty (change of) liver, not elsewhere classified: Secondary | ICD-10-CM | POA: Diagnosis not present

## 2017-05-31 MED ORDER — IOPAMIDOL (ISOVUE-300) INJECTION 61%
100.0000 mL | Freq: Once | INTRAVENOUS | Status: AC | PRN
  Administered 2017-05-31: 100 mL via INTRAVENOUS

## 2017-06-22 ENCOUNTER — Ambulatory Visit (INDEPENDENT_AMBULATORY_CARE_PROVIDER_SITE_OTHER): Payer: Medicaid Other | Admitting: Family Medicine

## 2017-06-22 ENCOUNTER — Encounter: Payer: Self-pay | Admitting: Family Medicine

## 2017-06-22 VITALS — BP 114/80 | HR 90 | Temp 98.6°F | Ht 68.0 in | Wt 229.0 lb

## 2017-06-22 DIAGNOSIS — E119 Type 2 diabetes mellitus without complications: Secondary | ICD-10-CM

## 2017-06-22 DIAGNOSIS — M7581 Other shoulder lesions, right shoulder: Secondary | ICD-10-CM

## 2017-06-22 LAB — POCT GLYCOSYLATED HEMOGLOBIN (HGB A1C): Hemoglobin A1C: 7.4

## 2017-06-22 NOTE — Progress Notes (Signed)
Subjective:  Brian Diaz is a 41 y.o. male who presents to the [facility] today for diabetes follow up  HPI:  Diabetes mellitus, Type 2: Disease Monitoring Blood Sugar Ranges: has not been checking Polyuria: none Visual problems: no   Urine Microalbumin: 150  Last A1C: 7.4 this visit  Medication Compliance: metformin 1000mg  BID, yes Medication Side Effects Hypoglycemia: reports no symptoms, does not check.  Does report loose stools, but wishes to continue with medication.   Preventitive Health Care Eye Exam: will schedule Foot Exam: will do today  Shoulder pain:  Continued pain at right shoulder worse with abduction and positive empty can. No numbness or tingling in upper extremities. Assessed two months ago and at that time had positive neers and hawkins. Recommended ice, tylenol and exercises. Continues to have discomfort with overhead movements and shoulder abduction >90 degrees.   PMH: type 2 diabetes Tobacco use: none, former smoker Medication: reviewed and updated ROS: see HPI   Objective:  Physical Exam: BP 114/80   Pulse 90   Temp 98.6 F (37 C) (Oral)   Ht 5\' 8"  (1.727 m)   Wt 229 lb (103.9 kg)   SpO2 98%   BMI 34.82 kg/m   Gen: 41yo M in NAD, resting comfortably CV: RRR with no murmurs appreciated Pulm: NWOB, CTAB with no crackles, wheezes, or rhonchi GI: Normal bowel sounds present. Soft, Nontender, Nondistended. MSK: no edema, cyanosis, or clubbing noted, FROM, but TTP on R proximal biceps tendon with +empty can and discomfort with abduction >90 degrees. Also TTP at supraspinatus.  Skin: warm, dry Neuro: grossly normal, moves all extremities Psych: Normal affect and thought content  Results for orders placed or performed in visit on 06/22/17 (from the past 72 hour(s))  POCT HgB A1C (CPT 83036)     Status: None   Collection Time: 06/22/17  1:40 PM  Result Value Ref Range   Hemoglobin A1C 7.4      Diabetic Foot Exam - Simple   Simple Foot Form Diabetic Foot exam was performed with the following findings:  Yes 06/22/2017  1:53 PM  Visual Inspection No deformities, no ulcerations, no other skin breakdown bilaterally:  Yes Sensation Testing Intact to touch and monofilament testing bilaterally:  Yes Pulse Check Posterior Tibialis and Dorsalis pulse intact bilaterally:  Yes Comments Normal exam    INJECTION: Patient was given informed consent, signed copy in the chart. Appropriate time out was taken. Area prepped and draped in usual sterile fashion. 1 cc of methylprednisolone 40 mg/ml plus  3 cc of 1% lidocaine without epinephrine was injected into the right subacromial space using a(n) posterior approach. The patient tolerated the procedure well. There were no complications. Post procedure instructions were given.  Assessment/Plan:  Type 2 diabetes mellitus (HCC) Taking metformin 1000mg  BID and A1C improved to 7.4 from 9.5 over past three months.  Does report GI upset, but wishes to continue with medication.  Expressed interest in stopping medication at some point.  At last visit was noted to have no microalbuminuria therefore will hold off on ACE inhibitor at this point.  - We will follow up in 3 months recheck A1c - Continue current regimen long with diet and exercise  Rotator cuff tendinitis, right TTP at supraspinatus, pain with shoulder abduction >90 degrees on right side and + empty can without numbness or tingling in extremities. Able to perform exercises provided, but given chronicity of discomfort decided on steroid injection in subacromial space.  - discussed return precautions -  continue exercises, ice and tylenol PRN - f/u in three months

## 2017-06-22 NOTE — Assessment & Plan Note (Signed)
Taking metformin 1000mg  BID and A1C improved to 7.4 from 9.5 over past three months.  Does report GI upset, but wishes to continue with medication.  Expressed interest in stopping medication at some point.  At last visit was noted to have no microalbuminuria therefore will hold off on ACE inhibitor at this point.  - We will follow up in 3 months recheck A1c - Continue current regimen long with diet and exercise

## 2017-06-22 NOTE — Patient Instructions (Signed)
You were seen today to follow up for diabetes and shoulder pain.  Your hemoglobin A1C is down to 7.4! Congrats.  Keep up the good work.  We will check the level again in three months and we can check your cholesterol at that point too.    We did a steroid injection in your shoulder today, too.  I hope that this calms down some of the inflammation and allows you to perform your job with less discomfort.    Please call me with any issues or questions.   Very nice seeing you today, Brian Diaz L. Rosalyn Gess, MD 06/22/2017 2:30 PM

## 2017-06-22 NOTE — Assessment & Plan Note (Signed)
TTP at supraspinatus, pain with shoulder abduction >90 degrees on right side and + empty can without numbness or tingling in extremities. Able to perform exercises provided, but given chronicity of discomfort decided on steroid injection in subacromial space.  - discussed return precautions - continue exercises, ice and tylenol PRN - f/u in three months

## 2017-08-16 ENCOUNTER — Encounter: Payer: Self-pay | Admitting: Gastroenterology

## 2017-08-16 ENCOUNTER — Ambulatory Visit (INDEPENDENT_AMBULATORY_CARE_PROVIDER_SITE_OTHER): Payer: Medicaid Other | Admitting: Gastroenterology

## 2017-08-16 VITALS — BP 110/82 | HR 78 | Ht 68.0 in | Wt 243.0 lb

## 2017-08-16 DIAGNOSIS — K219 Gastro-esophageal reflux disease without esophagitis: Secondary | ICD-10-CM | POA: Diagnosis not present

## 2017-08-16 DIAGNOSIS — R935 Abnormal findings on diagnostic imaging of other abdominal regions, including retroperitoneum: Secondary | ICD-10-CM

## 2017-08-16 DIAGNOSIS — K760 Fatty (change of) liver, not elsewhere classified: Secondary | ICD-10-CM | POA: Diagnosis not present

## 2017-08-16 NOTE — Progress Notes (Signed)
HPI :  41 y/o male here for a follow up visit. He was previously seen in January for new patient visit for epigastric pain. At that time he had a positive H. pylori antibody test and was treated for H. Pylori. He subsequently underwent endoscopy. EGD 02/15/17 - mild gastritis noted, exam otherwise normal. Biopsies negative for HP, it was eradicated RUQ [place] negative for gallstones  Basic labs done showing normal CBC and LFTs, but glucose randomly noted to be 342. Lipase was mildly elevated to 84.  A1c tested and returned at 9.5. Started on metformin 1000mg  BID. CT abdomen showed "mild acute pancreatitis", diffuse hepatic steatosis, although his symptoms of pain were chronic for a few months. IgG4 normal  Since the last visit the patient had a follow up CT scan 05/31/17 - diffuse pancreatic atrophy noted, no mass lesions, no biliary ductal dilation, hepatic steatosis. He is here for follow up. He is still on metformin. He thinks in general feeling better since starting this. A1c down to 7.4 at this time from 9.5. He thinks in general he has not had any significant abdominal pains bothering him and doing well. He has occasional postprandial burning in his epigastric after eating spicey foods. He does have occasional heartburn. He is eating okay. No vomiting. He denies any issues with his bowels in general. He is having roughly 1 BM per day or so, sometimes less than that. He thought at one point in time he had some "oil" in his stools but not as much recently. He is trying to eat much healthier lately.   Hepatic steatosis. No routine alcohol use. He thinks he has gained some weight recently, perhaps around 10 lbs.     Past Medical History:  Diagnosis Date  . Diabetes mellitus without complication (Christmas)   . Fatty liver   . H. pylori infection   . Obesity (BMI 30-39.9)      Past Surgical History:  Procedure Laterality Date  . TOOTH EXTRACTION  2009   Family History  Problem Relation Age of  Onset  . Heart disease Mother   . COPD Mother   . Colon cancer Neg Hx   . Stomach cancer Neg Hx   . Esophageal cancer Neg Hx    Social History  Substance Use Topics  . Smoking status: Former Smoker    Quit date: 09/27/2007  . Smokeless tobacco: Never Used  . Alcohol use Yes     Comment: occasional    Current Outpatient Prescriptions  Medication Sig Dispense Refill  . atorvastatin (LIPITOR) 20 MG tablet Take 2 tablets (40 mg total) by mouth daily. 90 tablet 3  . metFORMIN (GLUCOPHAGE) 1000 MG tablet Take 1 tablet (1,000 mg total) by mouth 2 (two) times daily with a meal. 60 tablet 3   Current Facility-Administered Medications  Medication Dose Route Frequency Provider Last Rate Last Dose  . 0.9 %  sodium chloride infusion  500 mL Intravenous Continuous Mariafernanda Hendricksen, Renelda Loma, MD       No Known Allergies   Review of Systems: All systems reviewed and negative except where noted in HPI.   Lab Results  Component Value Date   WBC 5.3 03/15/2017   HGB 14.5 03/15/2017   HCT 43.5 03/15/2017   MCV 81.3 03/15/2017   PLT 234.0 03/15/2017    Lab Results  Component Value Date   CREATININE 0.96 05/23/2017   BUN 5 (L) 05/23/2017   NA 139 05/23/2017   K 3.5 05/23/2017   CL  104 05/23/2017   CO2 29 05/23/2017    Lab Results  Component Value Date   ALT 24 03/15/2017   AST 15 03/15/2017   ALKPHOS 91 03/15/2017   BILITOT 0.5 03/15/2017     Physical Exam: BP 110/82 (BP Location: Left Arm, Patient Position: Sitting)   Pulse 78   Ht 5\' 8"  (1.727 m)   Wt 243 lb (110.2 kg)   SpO2 99%   BMI 36.95 kg/m  Constitutional: Pleasant,well-developed, male in no acute distress. HEENT: Normocephalic and atraumatic. Conjunctivae are normal. No scleral icterus. Neck supple.  Cardiovascular: Normal rate, regular rhythm.  Pulmonary/chest: Effort normal and breath sounds normal. No wheezing, rales or rhonchi. Abdominal: Soft, nondistended, nontender.  There are no masses palpable. No  hepatomegaly. Extremities: no edema Lymphadenopathy: No cervical adenopathy noted. Neurological: Alert and oriented to person place and time. Skin: Skin is warm and dry. No rashes noted. Psychiatric: Normal mood and affect. Behavior is normal.   ASSESSMENT AND PLAN: 41 year old male here for reassessment discuss the following issues:  Abnormal CT abdomen - history of epigastric pain as above, initial CT showed nonspecific mild inflammatory changes the pancreas. He was found to have newly diagnosed diabetes for which she has been treated. Follow-up CT scan of the pancreas showed generalized pancreatic atrophy without any mass lesions or concerning findings otherwise. I discussed this with him, this is a nonspecific finding - he has no symptoms and other features for chronic pancreatitis or other pathology. It is seen more commonly in patients who are obese. Given he had some "oil" reported in his stools, we'll check fecal elastase to assess for exocrine dysfunction. Otherwise monitor for symptoms, his pain has resolved and not bothering him anymore.  Fatty liver - discussed what this is and potential for causing Nash/cirrhosis. LFTs normal. Recommend he minimize alcohol use, and diet/exercise for goal weight loss. Check LFTs yearly for this issue  GERD - rare symptoms, I offered him Zantac but he declined, he'll use Tums or other over-the-counter antacids as needed.   Patient agreed with plan as outlined, he can follow-up in one year if he is otherwise stable.  [place] Cellar, MD [facility] Pager [phone]

## 2017-08-16 NOTE — Patient Instructions (Addendum)
Your physician has requested that you go to the basement for the following lab work before leaving today: Pancreatic fecal elastase  Please follow up with Dr. Havery Moros in 1 year.  If you are age 41 or older, your body mass index should be between 23-30. Your Body mass index is 36.95 kg/m. If this is out of the aforementioned range listed, please consider follow up with your Primary Care Provider.  If you are age 83 or younger, your body mass index should be between 19-25. Your Body mass index is 36.95 kg/m. If this is out of the aformentioned range listed, please consider follow up with your Primary Care Provider.

## 2017-08-20 ENCOUNTER — Other Ambulatory Visit: Payer: Medicaid Other

## 2017-08-20 DIAGNOSIS — K219 Gastro-esophageal reflux disease without esophagitis: Secondary | ICD-10-CM

## 2017-08-20 DIAGNOSIS — K760 Fatty (change of) liver, not elsewhere classified: Secondary | ICD-10-CM

## 2017-08-20 DIAGNOSIS — R935 Abnormal findings on diagnostic imaging of other abdominal regions, including retroperitoneum: Secondary | ICD-10-CM

## 2017-08-30 LAB — PANCREATIC ELASTASE, FECAL: Pancreatic Elastase-1, Stool: 19 mcg/g — ABNORMAL LOW

## 2017-09-05 ENCOUNTER — Other Ambulatory Visit: Payer: Self-pay

## 2017-09-05 MED ORDER — PANCRELIPASE (LIP-PROT-AMYL) 25000 UNITS PO CPEP: 2.0000 | ORAL_CAPSULE | ORAL | 3 refills | Status: DC

## 2017-12-28 ENCOUNTER — Encounter: Payer: Self-pay | Admitting: Gastroenterology

## 2018-02-05 ENCOUNTER — Other Ambulatory Visit: Payer: Self-pay | Admitting: Gastroenterology

## 2018-02-06 NOTE — Telephone Encounter (Signed)
If he needs an emergency refill, I will give him one refill. Otherwise for long term refills this should come from his primary care. Thanks

## 2018-02-06 NOTE — Telephone Encounter (Signed)
Would you like to refill Metformin? Pt last seen 07-2017. He does have a Primary Care dr., Dr.Warden, who fills other meds for him. Please advise.

## 2018-02-07 ENCOUNTER — Other Ambulatory Visit: Payer: Self-pay | Admitting: *Deleted

## 2018-02-07 NOTE — Telephone Encounter (Signed)
Left another message for pt to call me back.  Also Called Dr. Nida Boatman office and let them know pt is in need of refill on Metformin.

## 2018-02-07 NOTE — Telephone Encounter (Signed)
Called and LM for pt to call back.

## 2018-02-07 NOTE — Telephone Encounter (Signed)
Brian Diaz is receiving refill request for metformin.  Will forward to Dr. Rosalyn Gess. Katena Petitjean, Salome Spotted, CMA

## 2018-02-08 ENCOUNTER — Other Ambulatory Visit: Payer: Self-pay

## 2018-02-08 MED ORDER — METFORMIN HCL 1000 MG PO TABS: 1000.0000 mg | ORAL_TABLET | Freq: Two times a day (BID) | ORAL | 3 refills | Status: DC

## 2018-02-08 NOTE — Telephone Encounter (Signed)
Called and spoke to the pt. He has ran out of his medication Metformin. Did give one refill and instructed to call his primary for all additional refills. Follow up office visit also scheduled.

## 2018-02-21 ENCOUNTER — Ambulatory Visit: Payer: Self-pay | Admitting: Gastroenterology

## 2018-03-05 IMAGING — CT CT ABDOMEN WO/W CM
3 of 11 series · 12 of 46 positions shown, 17 images · IV contrast (ISOVUE 300)
Comparison: 03/08/2017 CT abdomen.

CLINICAL DATA: Recent episode of acute pancreatitis in February 2017.
Weight loss. Hepatic steatosis.

EXAM:
CT ABDOMEN WITHOUT AND WITH CONTRAST
TECHNIQUE: Multidetector CT imaging of the abdomen was performed following the
standard protocol before and following the bolus administration of
intravenous contrast.
CONTRAST:  100mL S4CG3X-KEE IOPAMIDOL (S4CG3X-KEE) INJECTION 61%

[Series 8: venous phase 5.0 b30f · axial · portal-venous · 0.78mm/px · z∈[-297,+3]mm · 3 of 61 slices shown, 7 images]
[im 1/61  soft-tissue]
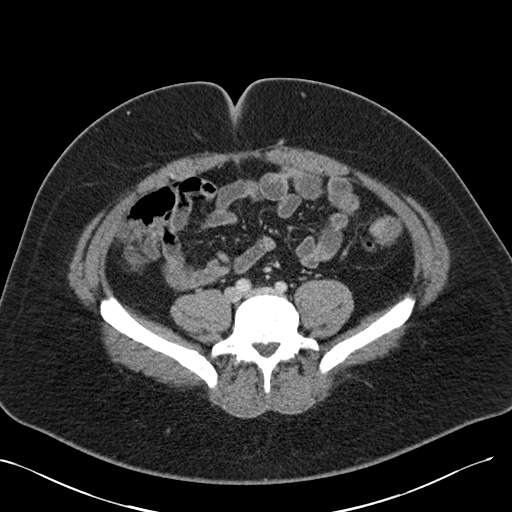
[im 1/61  lung]
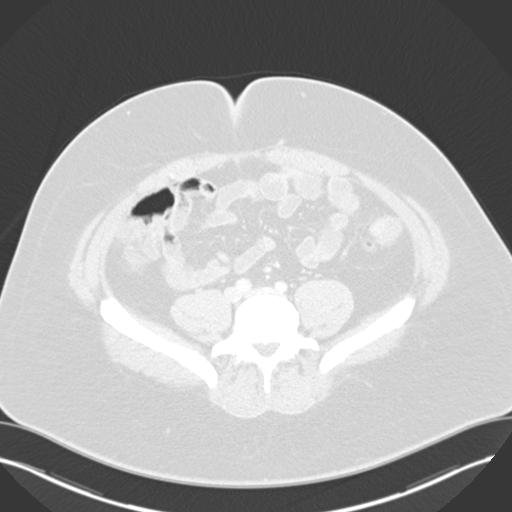
[im 1/61  bone]
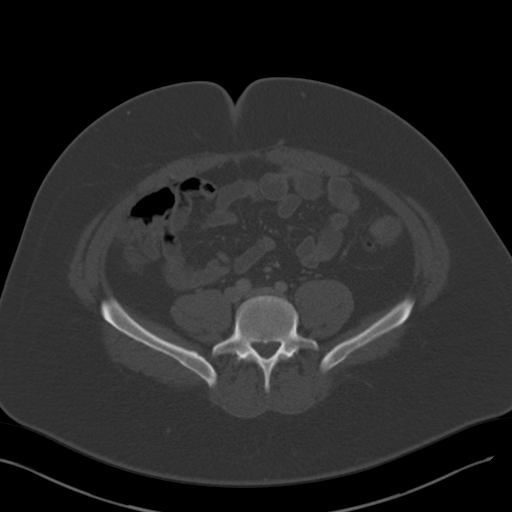
[im 31/61  soft-tissue]
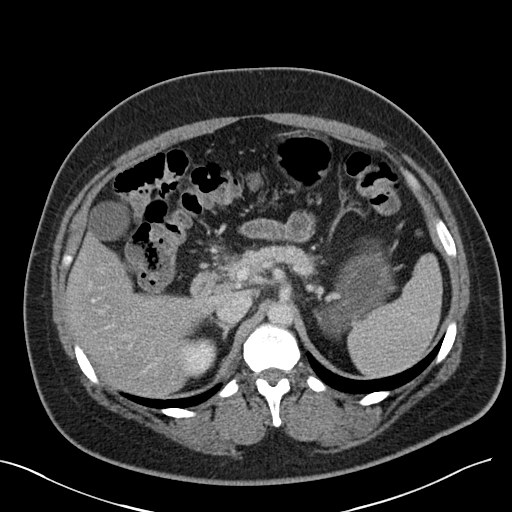
[im 31/61  lung]
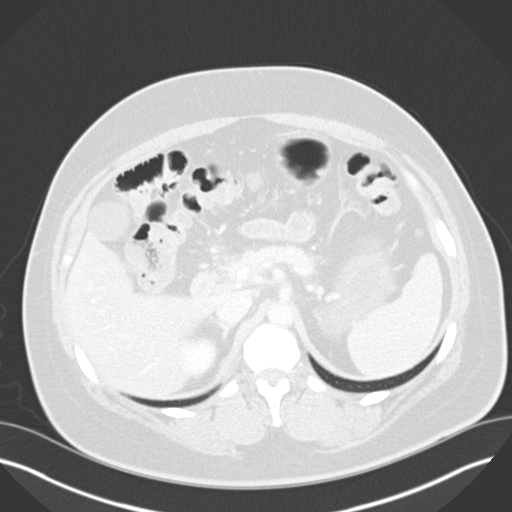
[im 61/61  soft-tissue]
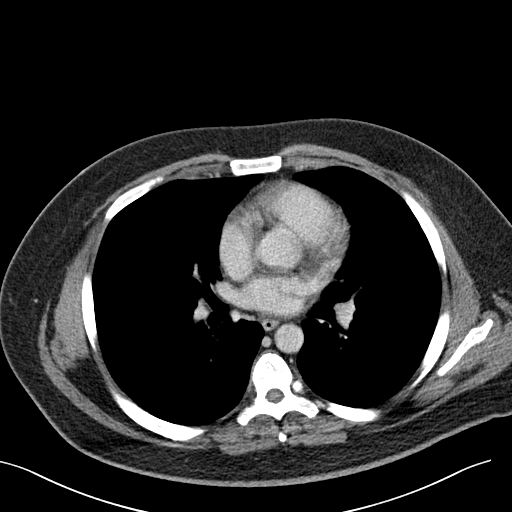
[im 61/61  lung]
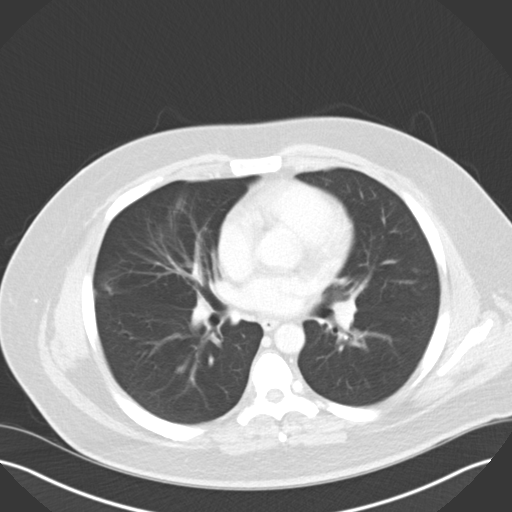

[Series 10: thins · axial · 0.78mm/px · z∈[-255,-41]mm · 6 of 151 slices shown]
[im 22/151  soft-tissue]
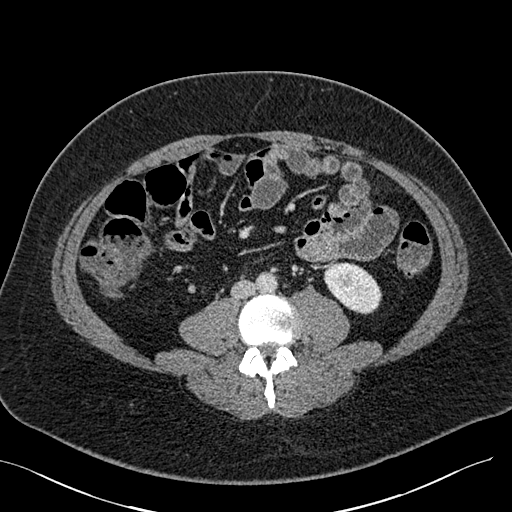
[im 43/151  soft-tissue]
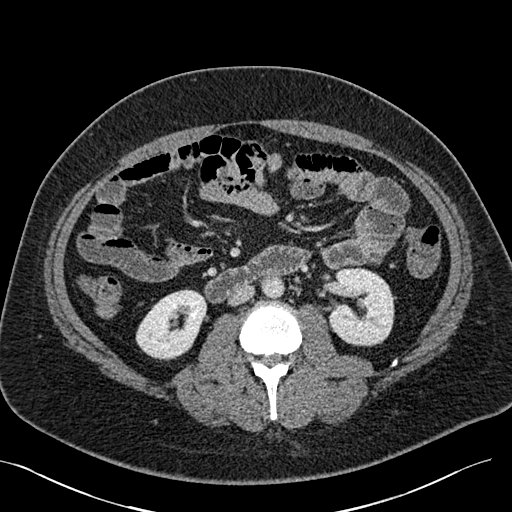
[im 65/151  soft-tissue]
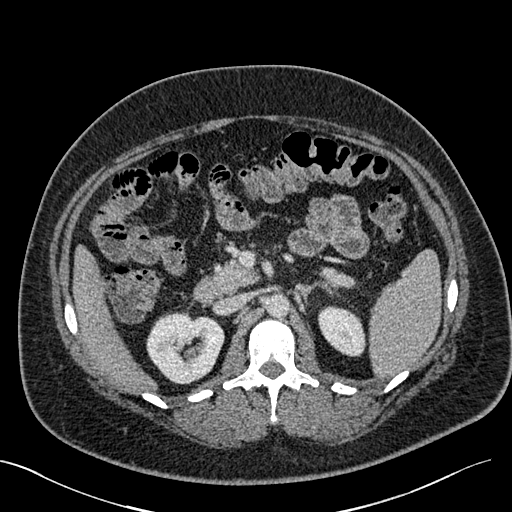
[im 86/151  soft-tissue]
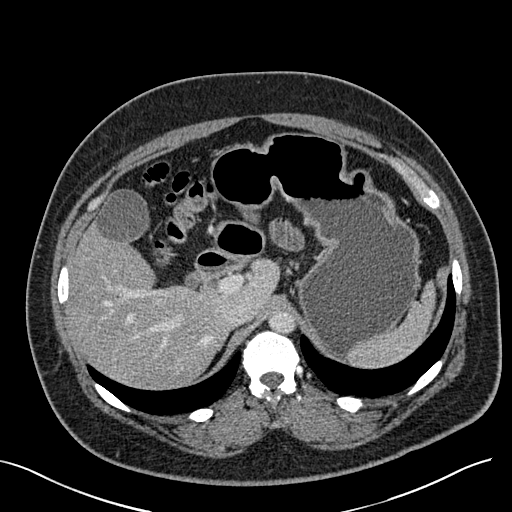
[im 108/151  soft-tissue]
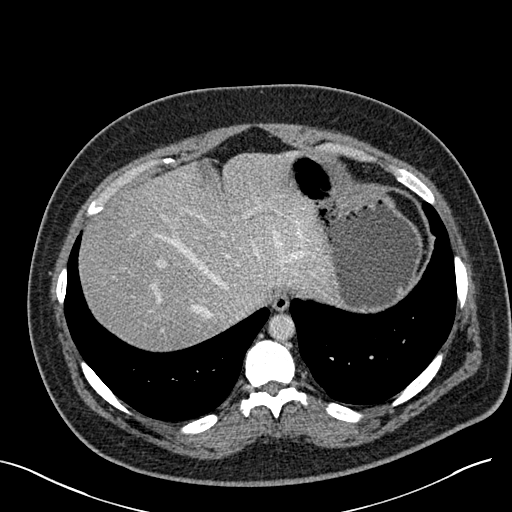
[im 129/151  soft-tissue]
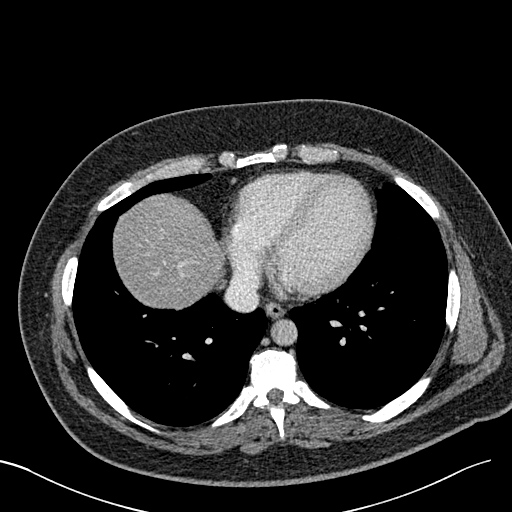

[Series 12: coronal arterial · coronal · arterial · 0.59mm/px · 3 of 105 slices shown, 4 images]
[im 27/105  soft-tissue]
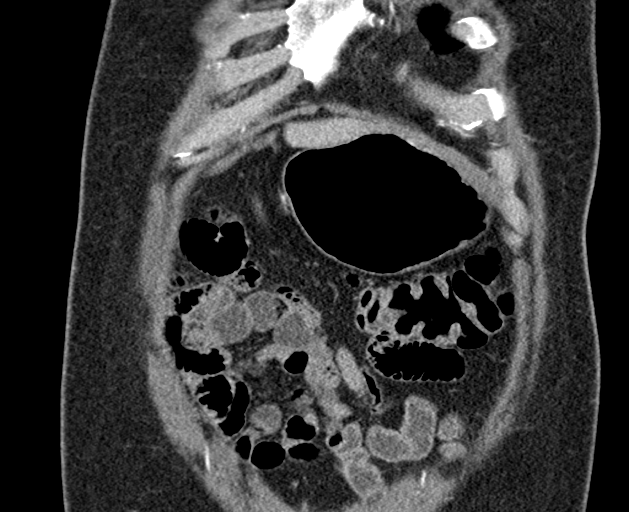
[im 53/105  soft-tissue]
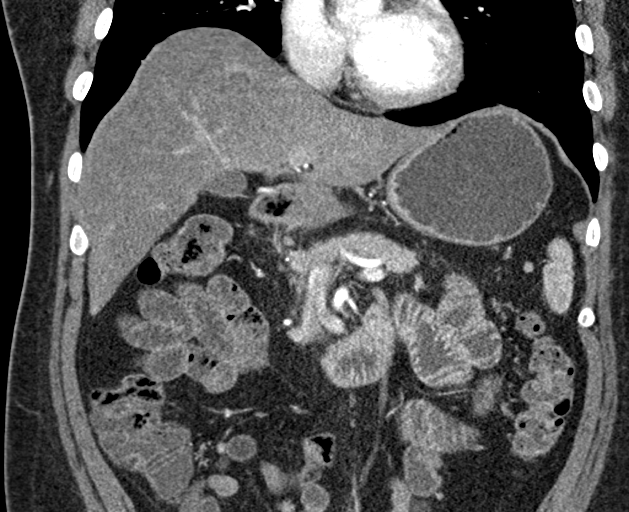
[im 53/105  bone]
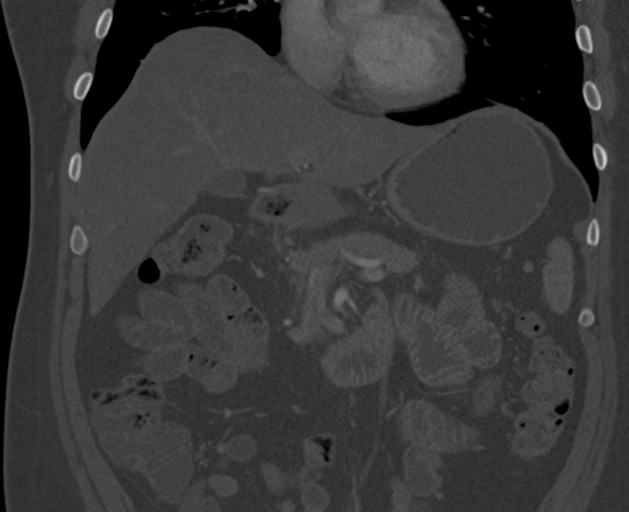
[im 79/105  soft-tissue]
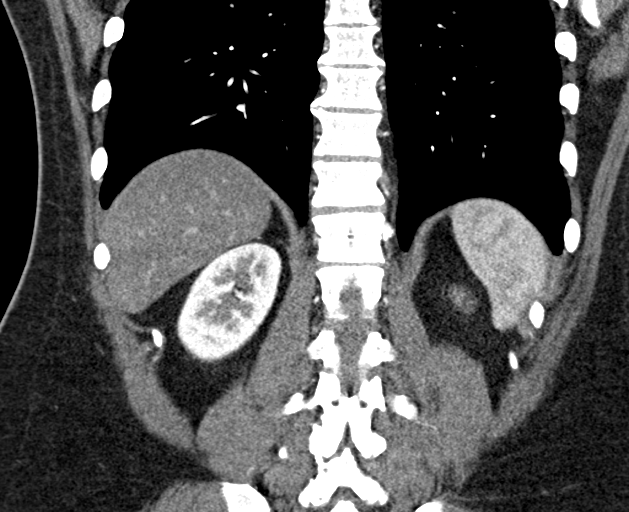

[12 of 46 positions shown; findings below may reference images not displayed]

FINDINGS: Lower chest: No significant pulmonary nodules or acute consolidative
airspace disease.

Hepatobiliary: Normal liver size. Moderate to severe diffuse hepatic
steatosis. No definite liver surface irregularity. No liver mass.
Normal gallbladder with no radiopaque cholelithiasis. No biliary
ductal dilatation.

Pancreas: Diffuse pancreatic parenchymal atrophy. No pancreatic mass
or duct dilation. No pancreatic parenchymal calcifications. No
peripancreatic fluid collections.

Spleen: Normal size. No mass.

Adrenals/Urinary Tract: Normal adrenals. No renal stones. No
hydronephrosis. No renal masses.

Stomach/Bowel: Grossly normal stomach. Visualized small and large
bowel is normal caliber, with no bowel wall thickening.

Vascular/Lymphatic: Normal caliber abdominal aorta. Patent portal,
splenic, hepatic and renal veins. No pathologically enlarged lymph
nodes in the abdomen.

Other: No pneumoperitoneum, ascites or focal fluid collection.

Musculoskeletal: No aggressive appearing focal osseous lesions.
Minimal thoracolumbar spondylosis.
IMPRESSION: 1. No acute abnormality.
2. Generalized pancreatic parenchymal atrophy. No pancreatic mass.
No pancreatic or biliary ductal dilatation.
3. Moderate to severe diffuse hepatic steatosis.

## 2018-05-14 ENCOUNTER — Ambulatory Visit: Payer: Medicaid Other | Admitting: Family Medicine

## 2018-05-14 ENCOUNTER — Encounter: Payer: Self-pay | Admitting: Family Medicine

## 2018-05-14 ENCOUNTER — Ambulatory Visit (HOSPITAL_COMMUNITY)
Admission: RE | Admit: 2018-05-14 | Discharge: 2018-05-14 | Disposition: A | Discharge status: Home / Self Care | Payer: Medicaid Other | Source: Ambulatory Visit | Attending: Family Medicine | Admitting: Family Medicine

## 2018-05-14 ENCOUNTER — Other Ambulatory Visit: Payer: Self-pay

## 2018-05-14 VITALS — BP 144/90 | HR 66 | Temp 98.5°F | Ht 68.0 in | Wt 228.0 lb

## 2018-05-14 DIAGNOSIS — E1165 Type 2 diabetes mellitus with hyperglycemia: Secondary | ICD-10-CM | POA: Diagnosis not present

## 2018-05-14 DIAGNOSIS — M5116 Intervertebral disc disorders with radiculopathy, lumbar region: Secondary | ICD-10-CM | POA: Insufficient documentation

## 2018-05-14 DIAGNOSIS — M5442 Lumbago with sciatica, left side: Secondary | ICD-10-CM

## 2018-05-14 DIAGNOSIS — K8681 Exocrine pancreatic insufficiency: Secondary | ICD-10-CM

## 2018-05-14 DIAGNOSIS — M5441 Lumbago with sciatica, right side: Secondary | ICD-10-CM

## 2018-05-14 DIAGNOSIS — G8929 Other chronic pain: Secondary | ICD-10-CM

## 2018-05-14 DIAGNOSIS — R030 Elevated blood-pressure reading, without diagnosis of hypertension: Secondary | ICD-10-CM | POA: Diagnosis not present

## 2018-05-14 LAB — POCT GLYCOSYLATED HEMOGLOBIN (HGB A1C): Hemoglobin A1C: 5.7 % — AB (ref 4.0–5.6)

## 2018-05-14 MED ORDER — METFORMIN HCL 1000 MG PO TABS: 500.0000 mg | ORAL_TABLET | Freq: Two times a day (BID) | ORAL | 3 refills | Status: DC

## 2018-05-14 NOTE — Patient Instructions (Signed)
It was a pleasure to see you today! Thank you for choosing [facility] for your primary care. Brian Diaz was seen for abdominal pain.   Our plans for today were:  I will message Dr. Duanne Guess to try to move up your appt.   Decrease your metformin to 500mg  twice per day.   You should return to our clinic to see Dr. Lindell Noe in 1 month for back pain.   Best,  Dr. Lindell Noe

## 2018-05-14 NOTE — Progress Notes (Signed)
CC: abdominal pain, DM, back pain  HPI  DM- last a1c 7.4 in 05/2017. 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  MRCP ordered, but not completed. Using metformin 1000mg  BID. Missed last GI appt, set up for repeat in July. Wasn't able to get MRCP 2/2 insurance is his understanding. Previously didn't have CBG monitor, got 1 2 weeks ago. CBGs 80-100s. Before he was diagnosed with DM, used to eat 1 meal per day at fast food restaurants. Now using a DM diet w 3 meals and 2 snacks per day. Feels full all the time. If he doesn't eat, he has diarrhea. BMs BID right now. Because he felt so full, he was eating food only every other day for the last 2 weeks.   Nausea in the mornings, off and on discomfort in epigastric area. Noted nausea every day x 1 week starting 2 weeks ago. Vomited Friday 2 weeks ago. Tried to and started vomiting several times, seemed different than previous vomiting episodes as a child.   No family hx of pancreatic issues in mother or father. Mother passed from COPD, father well per his understanding.   Back pain - been off and on since teenaged years. No incontinence. Denies surgery. Worse with prolonged sitting.   ROS: Denies CP, SOB, + epigastric cramping abdominal pain off and on, dysuria, changes in BMs.   CC, SH/smoking status, and VS noted  Objective: BP (!) 144/90   Pulse 66   Temp 98.5 F (36.9 C) (Oral)   Ht 5\' 8"  (1.727 m)   Wt 228 lb (103.4 kg)   SpO2 98%   BMI 34.67 kg/m  Gen: NAD, alert, cooperative, and pleasant. HEENT: NCAT, EOMI, PERRL CV: RRR, no murmur Resp: CTAB, no wheezes, non-labored Abd: SNTND, BS present, no guarding or organomegaly Ext: No edema, warm Neuro: Alert and oriented, Speech clear, No gross deficits  Assessment and plan:  Elevated BP without diagnosis of hypertension BP elevated today, no current medications, does endorse back pain today. Will recheck in 1 month and consider meds at that time.   Type 2 diabetes  mellitus (HCC) Given good BG control, a1c lowered, and patient with sporadic eating, will decrease metformin to 500mg  BID. Recheck in 3 months.   Exocrine pancreatic insufficiency Unclear etiology from my review of GI notes, diffuse pancreatic atrophy likely causing DM. Patient has been unable to get further imaging 2/2 insurance denial. I am concerned with his feelings of early satiety and nausea. I will message Dr. Havery Moros to see if they would like to move up his July appt or try to order further imaging again.   Chronic bilateral low back pain with bilateral sciatica No red flags, states he has been experiencing this for years. Paraspinal tightness and +discomfort with deep palpation. Normal ROM. No distal numbness. He is concerned that someone mentioned OA/DJD on his CT scan. Get lumbar XR and follow up in 1 month.    Orders Placed This Encounter  Procedures  . DG Lumbar Spine Complete    Standing Status:   Future    Number of Occurrences:   1    Standing Expiration Date:   07/15/2019    Order Specific Question:   Reason for Exam (SYMPTOM  OR DIAGNOSIS REQUIRED)    Answer:   lumbar pain    Order Specific Question:   Preferred imaging location?    Answer:   [facility]    Order Specific Question:   Radiology Contrast Protocol -  do NOT remove file path    Answer:   [email or web address].pdf  . HgB A1c    Meds ordered this encounter  Medications  . metFORMIN (GLUCOPHAGE) 1000 MG tablet    Sig: Take 0.5 tablets (500 mg total) by mouth 2 (two) times daily with a meal.    Dispense:  60 tablet    Refill:  3     Ralene Ok, MD, PGY2 05/16/2018 9:49 AM

## 2018-05-16 DIAGNOSIS — G8929 Other chronic pain: Secondary | ICD-10-CM | POA: Insufficient documentation

## 2018-05-16 DIAGNOSIS — R030 Elevated blood-pressure reading, without diagnosis of hypertension: Secondary | ICD-10-CM | POA: Insufficient documentation

## 2018-05-16 DIAGNOSIS — K8681 Exocrine pancreatic insufficiency: Secondary | ICD-10-CM | POA: Insufficient documentation

## 2018-05-16 DIAGNOSIS — M5442 Lumbago with sciatica, left side: Secondary | ICD-10-CM

## 2018-05-16 DIAGNOSIS — M5441 Lumbago with sciatica, right side: Secondary | ICD-10-CM

## 2018-05-16 NOTE — Assessment & Plan Note (Signed)
Given good BG control, a1c lowered, and patient with sporadic eating, will decrease metformin to 500mg  BID. Recheck in 3 months.

## 2018-05-16 NOTE — Assessment & Plan Note (Signed)
BP elevated today, no current medications, does endorse back pain today. Will recheck in 1 month and consider meds at that time.

## 2018-05-16 NOTE — Assessment & Plan Note (Signed)
Unclear etiology from my review of GI notes, diffuse pancreatic atrophy likely causing DM. Patient has been unable to get further imaging 2/2 insurance denial. I am concerned with his feelings of early satiety and nausea. I will message Dr. Havery Moros to see if they would like to move up his July appt or try to order further imaging again.

## 2018-05-16 NOTE — Assessment & Plan Note (Signed)
No red flags, states he has been experiencing this for years. Paraspinal tightness and +discomfort with deep palpation. Normal ROM. No distal numbness. He is concerned that someone mentioned OA/DJD on his CT scan. Get lumbar XR and follow up in 1 month.

## 2018-05-21 ENCOUNTER — Telehealth: Payer: Self-pay | Admitting: *Deleted

## 2018-05-21 NOTE — Telephone Encounter (Signed)
Will await callback from pt. Brian Diaz, Salome Spotted, CMA

## 2018-05-21 NOTE — Telephone Encounter (Signed)
-----   Message from Sela Hilding, MD sent at 05/21/2018  9:11 AM EDT ----- Called patient to discuss XR findings, no answer, left generic VM to return call to discuss imaging. 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 The arthritis is not concerning, but can flare up with pain. We can also discuss this at his upcoming appt.

## 2018-05-22 ENCOUNTER — Telehealth: Payer: Self-pay

## 2018-05-22 NOTE — Telephone Encounter (Signed)
Spoke to patient confirmed that he will come to the rescheduled office visit on 06/13/18 at 4:15.

## 2018-05-22 NOTE — Telephone Encounter (Signed)
Patient left message that he is returning call but left number not on his chart.  Call back is [phone].  Danley Danker, RN [facility] RN)

## 2018-05-22 NOTE — Telephone Encounter (Signed)
Pt informed of below. Zimmerman Rumple, April D, CMA  

## 2018-06-13 ENCOUNTER — Encounter: Payer: Self-pay | Admitting: Gastroenterology

## 2018-06-13 ENCOUNTER — Ambulatory Visit: Payer: Medicaid Other | Admitting: Gastroenterology

## 2018-06-13 ENCOUNTER — Other Ambulatory Visit (INDEPENDENT_AMBULATORY_CARE_PROVIDER_SITE_OTHER): Payer: Medicaid Other

## 2018-06-13 VITALS — BP 120/84 | HR 76 | Ht 68.0 in | Wt 231.0 lb

## 2018-06-13 DIAGNOSIS — K921 Melena: Secondary | ICD-10-CM | POA: Diagnosis not present

## 2018-06-13 DIAGNOSIS — K8689 Other specified diseases of pancreas: Secondary | ICD-10-CM | POA: Diagnosis not present

## 2018-06-13 DIAGNOSIS — K760 Fatty (change of) liver, not elsewhere classified: Secondary | ICD-10-CM

## 2018-06-13 DIAGNOSIS — R933 Abnormal findings on diagnostic imaging of other parts of digestive tract: Secondary | ICD-10-CM

## 2018-06-13 LAB — CBC WITH DIFFERENTIAL/PLATELET
Basophils Absolute: 0.1 10*3/uL (ref 0.0–0.1)
Basophils Relative: 1.5 % (ref 0.0–3.0)
EOS ABS: 0.3 10*3/uL (ref 0.0–0.7)
EOS PCT: 4.5 % (ref 0.0–5.0)
HEMATOCRIT: 45.8 % (ref 39.0–52.0)
HEMOGLOBIN: 15.5 g/dL (ref 13.0–17.0)
LYMPHS PCT: 36.3 % (ref 12.0–46.0)
Lymphs Abs: 2.2 10*3/uL (ref 0.7–4.0)
MCHC: 33.8 g/dL (ref 30.0–36.0)
MCV: 84.5 fl (ref 78.0–100.0)
MONOS PCT: 10 % (ref 3.0–12.0)
Monocytes Absolute: 0.6 10*3/uL (ref 0.1–1.0)
Neutro Abs: 2.9 10*3/uL (ref 1.4–7.7)
Neutrophils Relative %: 47.7 % (ref 43.0–77.0)
Platelets: 296 10*3/uL (ref 150.0–400.0)
RBC: 5.42 Mil/uL (ref 4.22–5.81)
RDW: 13.7 % (ref 11.5–15.5)
WBC: 6 10*3/uL (ref 4.0–10.5)

## 2018-06-13 LAB — COMPREHENSIVE METABOLIC PANEL
ALBUMIN: 4.4 g/dL (ref 3.5–5.2)
ALT: 23 U/L (ref 0–53)
AST: 17 U/L (ref 0–37)
Alkaline Phosphatase: 67 U/L (ref 39–117)
BUN: 8 mg/dL (ref 6–23)
CALCIUM: 9.4 mg/dL (ref 8.4–10.5)
CHLORIDE: 103 meq/L (ref 96–112)
CO2: 28 mEq/L (ref 19–32)
Creatinine, Ser: 0.86 mg/dL (ref 0.40–1.50)
GFR: 125.42 mL/min (ref >60.00)
Glucose, Bld: 98 mg/dL (ref 70–99)
POTASSIUM: 4.2 meq/L (ref 3.5–5.1)
Sodium: 138 mEq/L (ref 135–145)
Total Bilirubin: 0.4 mg/dL (ref 0.2–1.2)
Total Protein: 8 g/dL (ref 6.0–8.3)

## 2018-06-13 LAB — LIPASE: Lipase: 10 U/L — ABNORMAL LOW (ref 11.0–59.0)

## 2018-06-13 MED ORDER — SUPREP BOWEL PREP KIT 17.5-3.13-1.6 GM/177ML PO SOLN: ORAL | 0 refills | Status: DC

## 2018-06-13 NOTE — Patient Instructions (Addendum)
If you are age 42 or older, your body mass index should be between 23-30. Your Body mass index is 35.12 kg/m. If this is out of the aforementioned range listed, please consider follow up with your Primary Care Provider.  If you are age 28 or younger, your body mass index should be between 19-25. Your Body mass index is 35.12 kg/m. If this is out of the aformentioned range listed, please consider follow up with your Primary Care Provider.   You have been scheduled for a colonoscopy. Please follow written instructions given to you at your visit today.  Please pick up your prep supplies at the pharmacy within the next 1-3 days. If you use inhalers (even only as needed), please bring them with you on the day of your procedure. Your physician has requested that you go to www.startemmi.com and enter the access code given to you at your visit today. This web site gives a general overview about your procedure. However, you should still follow specific instructions given to you by our office regarding your preparation for the procedure.  Continue taking ZenPep.  Please go to the lab in the basement of our building to have lab work done as you leave today.  You have been scheduled for an MRI at [facility], located at [place]. [address] Santiago in the [facility]. Your appointment is scheduled on Thursday, 06-20-18 at 8:00am. Please arrive 30 minutes prior to your appointment time for registration purposes. Please make certain not to have anything to eat or drink after midnight. In addition, if you have any metal in your body, have a pacemaker or defibrillator, please be sure to let your ordering physician know. This test typically takes 45 minutes to 1 hour to complete. Should you need to reschedule, please call [phone].   Thank you for entrusting me with your care and for choosing [facility], Dr. [place] Cellar

## 2018-06-13 NOTE — Progress Notes (Signed)
HPI :  42 y/o male here for a follow up visit.  He was previously seen last year for abdominal pain. He had a positive H. Pylori antibody test was treated for H. Pylori. He underwent endoscopy in February 2018 noted to have mild gastritis, biopsies were negative for H. Pylori. He had an ultrasound that was negative for gallstones. He was noted to have an elevated lipase and was hyperglycemic and noted to have an A1c of 9.5, diagnosed with diabetes. He initially had a CT scan showing mild acute pancreatitis and diffuse hepatic steatosis. He denied drinking any alcohol. His triglycerides were okay. IgG 4 level was normal. He had a follow-up CT scan in June 2018 showing diffuse pancreatic atrophy noted. No mass lesions. He was noticing fatty/greasy stools after the CT scan was done. He had a pancreatic elastase level drawn which was extremely low at 19.  He was started on Zenpep, 500 U / kg / meal. He weighs 110kg, which is 55,000 units / meal.   He states since starting the Zenpep the grease in the stool has gone away. He states if he misses a dose she will experience it however. His diabetes is better controlled on metformin. Generally he has been doing okay although has had some episodes of abdominal pain.The week of May 10 2 states he had roughly 8-9 days of postprandial epigastric pain with decreased by mouth intake and weakness. He had an episode of vomiting during that time. Pain did not radiate to the back or shoulder. Pain was mostly after eating although had a low level most the time. This resolved on its own. He does occasionally have some sporadic epigastric pain. He feels as if eating fatty or greasy foods can sometimes precipitate it.  He otherwise has had episodic blood in the stools since of last seen him. This has occurred a few times. Last occurring in May. He endorses blood noted embedded within the stool. He states when he is on Zenpep he has some occasional constipation and straining.  When he takes both metformin and Zenpep at the same time his stools are fairly regular. No family history of colon cancer. Never had a prior colonoscopy. He continues to avoid drinking alcohol. He states his weight is about the same since of last seen him.   Past Medical History:  Diagnosis Date  . Diabetes mellitus without complication ([place])   . Fatty liver   . H. pylori infection   . Hyperlipidemia   . Obesity (BMI 30-39.9)   . Pancreatic insufficiency      Past Surgical History:  Procedure Laterality Date  . TOOTH EXTRACTION  2009   Family History  Problem Relation Age of Onset  . Heart disease Mother   . COPD Mother   . Colon cancer Neg Hx   . Stomach cancer Neg Hx   . Esophageal cancer Neg Hx    Social History   Tobacco Use  . Smoking status: Former Smoker    Types: Cigarettes    Last attempt to quit: 09/27/2007    Years since quitting: 10.7  . Smokeless tobacco: Never Used  Substance Use Topics  . Alcohol use: Yes    Comment: rare  . Drug use: No   Current Outpatient Medications  Medication Sig Dispense Refill  . atorvastatin (LIPITOR) 20 MG tablet Take 2 tablets (40 mg total) by mouth daily. 90 tablet 3  . metFORMIN (GLUCOPHAGE) 1000 MG tablet Take 0.5 tablets (500 mg total) by mouth  2 (two) times daily with a meal. 60 tablet 3  . MULTIPLE VITAMIN PO Take 1 tablet by mouth daily.    . Pancrelipase, Lip-Prot-Amyl, (ZENPEP) 25000 units CPEP Take 2 capsules by mouth as directed. Take 50,000 units 3 times daily before meals. 180 capsule 3   No current facility-administered medications for this visit.    No Known Allergies   Review of Systems: All systems reviewed and negative except where noted in HPI.   Lab Results  Component Value Date   WBC 5.3 03/15/2017   HGB 14.5 03/15/2017   HCT 43.5 03/15/2017   MCV 81.3 03/15/2017   PLT 234.0 03/15/2017    Lab Results  Component Value Date   CREATININE 0.96 05/23/2017   BUN 5 (L) 05/23/2017   NA 139  05/23/2017   K 3.5 05/23/2017   CL 104 05/23/2017   CO2 29 05/23/2017    Lab Results  Component Value Date   WBC 5.3 03/15/2017   HGB 14.5 03/15/2017   HCT 43.5 03/15/2017   MCV 81.3 03/15/2017   PLT 234.0 03/15/2017     Physical Exam: BP 120/84   Pulse 76   Ht 5\' 8"  (1.727 m)   Wt 231 lb (104.8 kg)   BMI 35.12 kg/m  Constitutional: Pleasant,well-developed, male in no acute distress. HEENT: Normocephalic and atraumatic. Conjunctivae are normal. No scleral icterus. Neck supple.  Cardiovascular: Normal rate, regular rhythm.  Pulmonary/chest: Effort normal and breath sounds normal. No wheezing, rales or rhonchi. Abdominal: Soft, nondistended, nontender.  There are no masses palpable. No hepatomegaly. Extremities: no edema Lymphadenopathy: No cervical adenopathy noted. Neurological: Alert and oriented to person place and time. Skin: Skin is warm and dry. No rashes noted. Psychiatric: Normal mood and affect. Behavior is normal.   ASSESSMENT AND PLAN: 42 year old male here for reassessment of the following issues:  Pancreatic insufficiency / abnormal imaging of the pancreas - as above, initially had a mild course of acute pancreatitis of unclear etiology. He does not drink alcohol. No evidence of gallstones. Triglycerides okay. IgG 4 level okay. No new medications he was on to cause it. He subsequently developed steatorrhea and noted to have low pancreatic elastase levels consistent with pancreatic insufficiency, and he also developed diabetes around that time as well. Unclear what caused this acute presentation, perhaps he had an unusual viral infection to precipitate this? Over time is been doing much better on Zenpep and metformin however did have an exacerbation of some symptoms last month. No lab work done at the time. I'm recommending basic labs today, now that is been a year out since his diagnosis of pancreatic insufficiency with recent flare of symptoms I'm recommending an  MRCP to reevaluate his pancreas, ensure no mass lesion (none seen on prior CT scan), and assess for any progression of atrophic changes. He was in agreement with this. Below no results of labs and imaging. Continue Zenpep for now.  Fatty liver - discussed this for a bit, we'll repeat LFTs. He is avoiding alcohol, working on weight loss.  Blood in the stools - A few episodes of hematochezia. Possibly related to hemorrhoids in the setting of intermittent hard stools, however given his age I'm recommending a colonoscopy to further evaluate, rule out polyps or mass lesions. Following discussion of risks and benefits of colonoscopy he wanted to proceed. Further recommendations pending the results of this exam.  Midway Cellar, MD [facility]  CC: Sela Hilding, MD

## 2018-06-19 ENCOUNTER — Encounter: Payer: Self-pay | Admitting: Family Medicine

## 2018-06-19 ENCOUNTER — Other Ambulatory Visit: Payer: Self-pay

## 2018-06-19 ENCOUNTER — Ambulatory Visit: Payer: Medicaid Other | Admitting: Family Medicine

## 2018-06-19 VITALS — BP 122/82 | HR 85 | Temp 98.4°F | Ht 68.0 in | Wt 233.6 lb

## 2018-06-19 DIAGNOSIS — M5442 Lumbago with sciatica, left side: Secondary | ICD-10-CM

## 2018-06-19 DIAGNOSIS — G8929 Other chronic pain: Secondary | ICD-10-CM

## 2018-06-19 DIAGNOSIS — M5441 Lumbago with sciatica, right side: Secondary | ICD-10-CM | POA: Diagnosis not present

## 2018-06-19 MED ORDER — CYCLOBENZAPRINE HCL 10 MG PO TABS: 10.0000 mg | ORAL_TABLET | Freq: Three times a day (TID) | ORAL | 0 refills | Status: DC | PRN

## 2018-06-19 NOTE — Progress Notes (Signed)
   CC: back pain  HPI  Back pain - comes and goes, "fades in and out." sometimes has a stretch of 2-3 days without pain. 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 Both sides around lower back and towards buttocks. Went grocery shopping yesterday, got 2 large cases of water. Had trouble bending his back to put the groceries away, had to squat down. Surges of pain that were sharp and made him yell out. Laid down for about an hour, when he tried to cautiusly go to another room. When he turned, he "crumbled," with a surging pain, and caught himself on the bed. He felt exhausted and "breathing like he had been on the treadmill." doesn't wear a back brace.   Pancreatic insufficiency - getting MRI tomorrow, saw Dr. Havery Moros last week. Labs normal at that time. Also scheduled for colonoscopy. He is very anxious about "receiving bad news."   ROS: Denies CP, SOB, abdominal pain, dysuria.   CC, SH/smoking status, and VS noted  Objective: BP 122/82   Pulse 85   Temp 98.4 F (36.9 C) (Oral)   Ht 5\' 8"  (1.727 m)   Wt 233 lb 9.6 oz (106 kg)   SpO2 98%   BMI 35.52 kg/m  Gen: NAD, alert, cooperative, and pleasant. HEENT: NCAT, EOMI, PERRL CV: RRR, no murmur Resp: CTAB, no wheezes, non-labored Abd: SNTND, BS present, no guarding or organomegaly Back: no spinal tenderness from cervical spine to sacrum, lumbar paraspinal tenderness bilaterally.  Ext: No edema, warm Neuro: Alert and oriented, Speech clear, No gross deficits, normal strength and sensation bilateral legs and feet.   Assessment and plan:  Chronic bilateral low back pain with bilateral sciatica Reassured patient that these are not stroke like symptoms. Explained DDD and likihood to continue to flare up periodically. 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 Referred to PT for strengthening exercises. Trial short course of muscle relaxers.    Orders Placed  This Encounter  Procedures  . Ambulatory referral to Physical Therapy    Referral Priority:   Routine    Referral Type:   Physical Medicine    Referral Reason:   Specialty Services Required    Requested Specialty:   Physical Therapy    Number of Visits Requested:   1    Meds ordered this encounter  Medications  . cyclobenzaprine (FLEXERIL) 10 MG tablet    Sig: Take 1 tablet (10 mg total) by mouth 3 (three) times daily as needed for muscle spasms.    Dispense:  30 tablet    Refill:  0     Ralene Ok, MD, PGY2 06/20/2018 7:36 AM

## 2018-06-19 NOTE — Patient Instructions (Signed)
It was a pleasure to see you today! Thank you for choosing [facility] for your primary care. Brian Diaz was seen for back pain.   Our plans for today were:  You have a muscle strain of your back, and you have some mild arthritis of your back bones as well.   Consider trying a back brace for work.   Take the muscle relaxer before bed, up to three times per day if you don't experience a lot of sleepiness when you take it.   A short course of ibuprofen is ok for the pain. Tylenol is always safe.   I am placing a referral to physical therapy.    Best,  Dr. Lindell Noe

## 2018-06-20 ENCOUNTER — Other Ambulatory Visit: Payer: Self-pay | Admitting: Gastroenterology

## 2018-06-20 ENCOUNTER — Ambulatory Visit (HOSPITAL_COMMUNITY)
Admission: RE | Admit: 2018-06-20 | Discharge: 2018-06-20 | Disposition: A | Discharge status: Home / Self Care | Payer: Medicaid Other | Source: Ambulatory Visit | Attending: Gastroenterology | Admitting: Gastroenterology

## 2018-06-20 DIAGNOSIS — K921 Melena: Secondary | ICD-10-CM | POA: Diagnosis not present

## 2018-06-20 DIAGNOSIS — R933 Abnormal findings on diagnostic imaging of other parts of digestive tract: Secondary | ICD-10-CM

## 2018-06-20 DIAGNOSIS — K760 Fatty (change of) liver, not elsewhere classified: Secondary | ICD-10-CM

## 2018-06-20 DIAGNOSIS — K8689 Other specified diseases of pancreas: Secondary | ICD-10-CM | POA: Diagnosis present

## 2018-06-20 MED ORDER — GADOBENATE DIMEGLUMINE 529 MG/ML IV SOLN
20.0000 mL | Freq: Once | INTRAVENOUS | Status: AC | PRN
  Administered 2018-06-20: 20 mL via INTRAVENOUS

## 2018-06-20 NOTE — Assessment & Plan Note (Signed)
Reassured patient that these are not stroke like symptoms. Explained DDD and likihood to continue to flare up periodically. 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 Referred to PT for strengthening exercises. Trial short course of muscle relaxers.

## 2018-07-02 ENCOUNTER — Ambulatory Visit: Payer: Medicaid Other | Admitting: Gastroenterology

## 2018-07-04 ENCOUNTER — Encounter: Payer: Self-pay | Admitting: Physical Therapy

## 2018-07-04 ENCOUNTER — Ambulatory Visit: Payer: Medicaid Other | Attending: Family Medicine | Admitting: Physical Therapy

## 2018-07-04 ENCOUNTER — Other Ambulatory Visit: Payer: Self-pay

## 2018-07-04 DIAGNOSIS — M5441 Lumbago with sciatica, right side: Secondary | ICD-10-CM | POA: Insufficient documentation

## 2018-07-04 DIAGNOSIS — M62838 Other muscle spasm: Secondary | ICD-10-CM | POA: Diagnosis present

## 2018-07-04 DIAGNOSIS — R293 Abnormal posture: Secondary | ICD-10-CM | POA: Diagnosis present

## 2018-07-04 DIAGNOSIS — M5442 Lumbago with sciatica, left side: Secondary | ICD-10-CM | POA: Diagnosis present

## 2018-07-04 DIAGNOSIS — G8929 Other chronic pain: Secondary | ICD-10-CM | POA: Insufficient documentation

## 2018-07-04 NOTE — Therapy (Signed)
[place], [place], [address] Phone: [phone]   Fax:  [phone]  Physical Therapy Evaluation  Patient Details  Name: Brian Diaz MRN: [id number] Date of Birth: 1976-10-06 Referring Provider: Dr Sela Hilding   Encounter Date: 07/04/2018  PT End of Session - 07/04/18 0857    Visit Number  1    Number of Visits  4    Date for PT Re-Evaluation  08/01/18    Authorization Type  Mediciad     PT Start Time  0845    PT Stop Time  0928    PT Time Calculation (min)  43 min    Activity Tolerance  Patient tolerated treatment well    Behavior During Therapy  [facility] for tasks assessed/performed       Past Medical History:  Diagnosis Date  . Diabetes mellitus without complication ([place])   . Fatty liver   . H. pylori infection   . Hyperlipidemia   . Obesity (BMI 30-39.9)   . Pancreatic insufficiency     Past Surgical History:  Procedure Laterality Date  . TOOTH EXTRACTION  2009    There were no vitals filed for this visit.   Subjective Assessment - 07/04/18 0852    Subjective  Patient began having mid thoracic spasmingin his back about 23 weeks ago. They began resolving on Tuesday. He has a history of lower back pain. His job he was working involved heavy lifting.     How long can you sit comfortably?  25-30 minutes before the back stiffnes up    How long can you stand comfortably?  < 20 min     How long can you walk comfortably?  limited community distances     Diagnostic tests  X-ray: multilevel DDD worst at L2-L3    Patient Stated Goals  to have less pain in his back and be able to lift things at work     Currently in Pain?  Yes    Pain Score  5     Pain Location  Back    Pain Orientation  Right;Left    Pain Descriptors / Indicators  Aching    Pain Type  Chronic pain    Pain Onset  More than a month ago    Pain Frequency  Constant    Aggravating Factors   different positioning     Pain  Relieving Factors  icy hot patches;     Effect of Pain on Daily Activities  unable to perfrom work tasks         [facility] PT Assessment - 07/04/18 0001      Assessment   Referring Provider  Dr Sela Hilding    Hand Dominance  Right    Next MD Visit  A few months from now     Prior Therapy  No therapy for his back       Precautions   Precautions  None      Restrictions   Weight Bearing Restrictions  No      Balance Screen   Has the patient fallen in the past 6 months  No    Has the patient had a decrease in activity level because of a fear of falling?   No    Is the patient reluctant to leave their home because of a fear of falling?   No      Prior Function   Level of Independence  Independent    Vocation Requirements  May go back to a job where he will be lifting      Cognition   Overall Cognitive Status  Within Functional Limits for tasks assessed      Observation/Other Assessments   Focus on Therapeutic Outcomes (FOTO)   Medicaid       Sensation   Additional Comments  radiating pain fdown the back of the legs L > R       Coordination   Gross Motor Movements are Fluid and Coordinated  Yes    Fine Motor Movements are Fluid and Coordinated  Yes      Posture/Postural Control   Posture Comments  rounded shoulder; forward head; flexed spine in sitting; rounded spine in sitting       ROM / Strength   AROM / PROM / Strength  AROM;PROM;Strength      AROM   AROM Assessment Site  Lumbar    Lumbar Flexion  limited 50 %     Lumbar Extension  limited 75% with pain     Lumbar - Right Side Bend  pain on the left     Lumbar - Left Side Bend  pain on the right     Lumbar - Right Rotation  no pain     Lumbar - Left Rotation  no pain       PROM   PROM Assessment Site  Hip      Strength   Strength Assessment Site  Hip    Right/Left Hip  Right;Left    Right Hip Flexion  4/5    Right Hip ABduction  4/5    Left Hip Flexion  4+/5    Left Hip ABduction  4+/5       Flexibility   Soft Tissue Assessment /Muscle Length  yes    Hamstrings  90/90 40 degree limitation bilateral       Palpation   Palpation comment  sapamsing in bilateral lower lumbar paraspinals and into lateral gluteal area bilateral       Special Tests   Other special tests  SLR (-)       Ambulation/Gait   Gait Comments  decreased hip flexion bilateral                 Objective measurements completed on examination: See above findings.      [facility] Adult PT Treatment/Exercise - 07/04/18 0001      Lumbar Exercises: Stretches   Active Hamstring Stretch  3 reps;20 seconds    Active Hamstring Stretch Limitations  reviewed sitting and 90/90     Pelvic Tilt Limitations  x10 with mod cuing       Lumbar Exercises: Standing   Other Standing Lumbar Exercises  shoulder extension x10 yellow     Other Standing Lumbar Exercises  tennis ball release       Modalities   Modalities  Moist Heat      Moist Heat Therapy   Number Minutes Moist Heat  10 Minutes    Moist Heat Location  Lumbar Spine             PT Education - 07/04/18 1422    Education Details  reviewed HEP; symptom management, posture     Person(s) Educated  Patient    Methods  Explanation;Demonstration;Tactile cues;Verbal cues    Comprehension  Verbalized understanding;Returned demonstration;Tactile cues required;Need further instruction       PT Short Term Goals - 07/04/18 2027      PT SHORT TERM GOAL #1  Title  Patient will increase bilateral hamstring lenght by 15 degrees     Baseline  bilateral 40 degree limitations     Time  3    Period  Weeks    Status  New    Target Date  07/25/18      PT SHORT TERM GOAL #2   Title  Patient will report centralized lower back pain < 3/10     Baseline  5/10 pain radiating down into bilateral lower extremitys     Time  3    Period  Weeks    Status  New    Target Date  07/25/18      PT SHORT TERM GOAL #3   Title  Patient will be independent with inital HEP      Time  3    Period  Weeks    Status  New    Target Date  07/25/18                Plan - 07/04/18 1425    Clinical Impression Statement  Patient is a 42 year old male with lower back pain that radiates into both legs. He has also had arecent obut of spasming in his mid thoraici spine. He was having to do heavey lifting at his job. Signs and symptoms are consitent with diagnosis of lumbar DDD. He has poor posture which is likley contributing to his mid thoracic spasming. He has bilateral lower extremity wekaness R > L. He reports more pain on the left though. He would benefit from skilled therapy to improve abiltiy to lift, improve left knee strength and to decrease pain in his low back pain and thoracic spine. He was seen for a moderat complexity eval 2nd to multiple areas of pain and co-morbidities that effect the treatment.     History and Personal Factors relevant to plan of care:  DMII, obesity; right knee pain; hamstring stretch     Clinical Presentation  Evolving    Clinical Presentation due to:  increasing pain and spasming in the back; increasing difficulty perfroming work tasks     Clinical Decision Making  Moderate    Rehab Potential  Good    PT Frequency  2x / week    PT Duration  8 weeks    PT Treatment/Interventions  ADLs/Self Care Home Management;Electrical Stimulation;Cryotherapy;Iontophoresis 4mg /ml Dexamethasone;Ultrasound;Therapeutic exercise;Therapeutic activities;Gait training;Functional mobility training;Neuromuscular re-education;Patient/family education;Manual techniques;Passive range of motion    PT Next Visit Plan  continue with core strengthening progression. Patient stated during treatment that bridge was dififuclt for him to do. Continue with posterior pelvic tiltand progress to half brige. Patient also has hip flexor weakness and abbductor wekaness. Add supine march with ab and march; Consider soft tissue mobilization. At some point review lifting techniques  with the patient.     PT Home Exercise Plan  posterior pelvic tilt; given tennis ball for trigger point release; lat pull down with TA;     Consulted and Agree with Plan of Care  Patient       Patient will benefit from skilled therapeutic intervention in order to improve the following deficits and impairments:  Abnormal gait, Pain, Decreased knowledge of precautions, Decreased safety awareness, Decreased strength, Decreased range of motion, Decreased endurance, Impaired sensation  Visit Diagnosis: Chronic bilateral low back pain with bilateral sciatica  Other muscle spasm  Abnormal posture     Problem List Patient Active Problem List   Diagnosis Date Noted  . Elevated BP without diagnosis of  hypertension 05/16/2018  . 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 Chronic bilateral low back pain with bilateral sciatica 05/16/2018  . Hyperlipemia 04/24/2017  . Rotator cuff tendinitis, right 04/24/2017  . Type 2 diabetes mellitus ([place]) 04/23/2017    Carney Living PT DPT  07/04/2018, 8:36 PM  [facility] [address] [place], [place], [address] Phone: [phone]   Fax:  [phone]  Name: Brian Diaz MRN: [id number] Date of Birth: Jun 26, 1976

## 2018-07-17 ENCOUNTER — Encounter: Payer: Self-pay | Admitting: Physical Therapy

## 2018-07-17 ENCOUNTER — Ambulatory Visit: Payer: Medicaid Other | Admitting: Physical Therapy

## 2018-07-17 DIAGNOSIS — M5442 Lumbago with sciatica, left side: Secondary | ICD-10-CM | POA: Diagnosis not present

## 2018-07-17 DIAGNOSIS — R293 Abnormal posture: Secondary | ICD-10-CM

## 2018-07-17 DIAGNOSIS — M5441 Lumbago with sciatica, right side: Principal | ICD-10-CM

## 2018-07-17 DIAGNOSIS — G8929 Other chronic pain: Secondary | ICD-10-CM

## 2018-07-17 DIAGNOSIS — M62838 Other muscle spasm: Secondary | ICD-10-CM

## 2018-07-17 NOTE — Therapy (Signed)
[place], [place], [address] Phone: [phone]   Fax:  [phone]  Physical Therapy Treatment  Patient Details  Name: Neftali Abair MRN: [id number] Date of Birth: 1976-05-23 Referring Provider: Dr Sela Hilding   Encounter Date: 07/17/2018  PT End of Session - 07/17/18 1550    Visit Number  2    Number of Visits  4    Date for PT Re-Evaluation  08/01/18    PT Start Time  1503    PT Stop Time  1545    PT Time Calculation (min)  42 min    Activity Tolerance  Patient tolerated treatment well    Behavior During Therapy  [facility] for tasks assessed/performed       Past Medical History:  Diagnosis Date  . Diabetes mellitus without complication ([place])   . Fatty liver   . H. pylori infection   . Hyperlipidemia   . Obesity (BMI 30-39.9)   . Pancreatic insufficiency     Past Surgical History:  Procedure Laterality Date  . TOOTH EXTRACTION  2009    There were no vitals filed for this visit.  Subjective Assessment - 07/17/18 1509    Subjective  First morning  stretch id harder to the evening stretch.  i di dthe exercises 2 x a day.    Currently in Pain?  Yes    Pain Score  4     Pain Location  Back    Pain Orientation  Right;Left    Pain Descriptors / Indicators  Spasm upper mid back spasm,  no spasm in low back,  feels like something gripping you in the lower back an awarenes..     Pain Type  Chronic pain    Multiple Pain Sites  -- tight in upper back last thursday with mowing ,  left ankle anter foot  pain                       [facility] Adult PT Treatment/Exercise - 07/17/18 0001      Self-Care   Self-Care  Posture bed , sitting ADL      Lumbar Exercises: Stretches   Active Hamstring Stretch  30 seconds;3 reps mod cues  tries sit and supine with sheet    Pelvic Tilt Limitations  10 x      Lumbar Exercises: Supine   Other Supine Lumbar Exercises  Decompression 1 minute,   shoulder press,  head press and leg press 5 x 5,  leg ;lengthener.  pillow needed left arm,  Right leg pillow needed,               PT Education - 07/17/18 1547    Education Details  HEP,  posture    Person(s) Educated  Patient    Methods  Explanation;Demonstration;Tactile cues;Verbal cues;Handout    Comprehension  Verbalized understanding;Returned demonstration       PT Short Term Goals - 07/04/18 2027      PT SHORT TERM GOAL #1   Title  Patient will increase bilateral hamstring lenght by 15 degrees     Baseline  bilateral 40 degree limitations     Time  3    Period  Weeks    Status  New    Target Date  07/25/18      PT SHORT TERM GOAL #2   Title  Patient will report centralized lower back pain < 3/10     Baseline  5/10 pain  radiating down into bilateral lower extremitys     Time  3    Period  Weeks    Status  New    Target Date  07/25/18      PT SHORT TERM GOAL #3   Title  Patient will be independent with inital HEP     Time  3    Period  Weeks    Status  New    Target Date  07/25/18               Plan - 07/17/18 1556    Clinical Impression Statement  Progressed HEP.  mild pain increases during exercise asesd with pillows.  Patient declined the need for modalities. No spasm today.    PT Home Exercise Plan  posterior pelvic tilt; given tennis ball for trigger point release; lat pull down with TA; Decompression    Consulted and Agree with Plan of Care  Patient       Patient will benefit from skilled therapeutic intervention in order to improve the following deficits and impairments:     Visit Diagnosis: Chronic bilateral low back pain with bilateral sciatica  Other muscle spasm  Abnormal posture     Problem List Patient Active Problem List   Diagnosis Date Noted  . Elevated BP without diagnosis of hypertension 05/16/2018  . 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 Chronic bilateral low back pain with bilateral sciatica 05/16/2018  .  Hyperlipemia 04/24/2017  . Rotator cuff tendinitis, right 04/24/2017  . Type 2 diabetes mellitus ([place]) 04/23/2017    HARRIS,KAREN PTA 07/17/2018, 4:07 PM  [facility] [address] [place], [place], [address] Phone: [phone]   Fax:  [phone]  Name: Christophe Rising MRN: [id number] Date of Birth: December 06, 1976

## 2018-07-17 NOTE — Patient Instructions (Signed)
Decompression series All issued 5 x 5 seconds each Daily Decompression 5 to 15 minutes

## 2018-07-24 ENCOUNTER — Ambulatory Visit: Payer: Medicaid Other | Admitting: Physical Therapy

## 2018-07-24 ENCOUNTER — Encounter: Payer: Self-pay | Admitting: Physical Therapy

## 2018-07-24 DIAGNOSIS — G8929 Other chronic pain: Secondary | ICD-10-CM

## 2018-07-24 DIAGNOSIS — M5442 Lumbago with sciatica, left side: Principal | ICD-10-CM

## 2018-07-24 DIAGNOSIS — M5441 Lumbago with sciatica, right side: Principal | ICD-10-CM

## 2018-07-24 DIAGNOSIS — R293 Abnormal posture: Secondary | ICD-10-CM

## 2018-07-24 DIAGNOSIS — M62838 Other muscle spasm: Secondary | ICD-10-CM

## 2018-07-25 ENCOUNTER — Encounter: Payer: Self-pay | Admitting: Physical Therapy

## 2018-07-25 NOTE — Therapy (Signed)
[place], [place], [address] Phone: [phone]   Fax:  [phone]  Physical Therapy Treatment  Patient Details  Name: Brian Diaz MRN: [id number] Date of Birth: 1976/11/29 Referring Provider: Dr Sela Hilding   Encounter Date: 07/24/2018  PT End of Session - 07/24/18 1553    Visit Number  3    Number of Visits  4    PT Start Time  1500    PT Stop Time  1540    PT Time Calculation (min)  41 min    Activity Tolerance  Patient tolerated treatment well    Behavior During Therapy  [facility] for tasks assessed/performed       Past Medical History:  Diagnosis Date  . Diabetes mellitus without complication ([place])   . Fatty liver   . H. pylori infection   . Hyperlipidemia   . Obesity (BMI 30-39.9)   . Pancreatic insufficiency     Past Surgical History:  Procedure Laterality Date  . TOOTH EXTRACTION  2009    There were no vitals filed for this visit.  Subjective Assessment - 07/24/18 1550    Subjective  Patient reports he has been doing his stretching withou tmuch change in his lower back pain. He is no longer having spasming in his upper back though. he has a job interview [place].     How long can you sit comfortably?  25-30 minutes before the back stiffnes up    How long can you stand comfortably?  < 20 min     How long can you walk comfortably?  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  Yes    Pain Score  4     Pain Location  Back    Pain Orientation  Right;Left    Pain Descriptors / Indicators  Aching    Pain Type  Chronic pain    Pain Onset  More than a month ago    Pain Frequency  Constant    Aggravating Factors   different positioning     Pain Relieving Factors  icy hot patches     Effect of Pain on Daily Activities  unable to perfrom work tasks                        [facility] Adult PT  Treatment/Exercise - 07/25/18 0001      Lumbar Exercises: Stretches   Active Hamstring Stretch  30 seconds;3 reps mod cues  tries sit and supine with sheet    Single Knee to Chest Stretch  3 reps;20 seconds    Pelvic Tilt Limitations  10 x    Figure 4 Stretch  3 reps;20 seconds      Lumbar Exercises: Standing   Other Standing Lumbar Exercises  shoulder extension 2x10 red; scap retraction 2x10 red       Lumbar Exercises: Supine   Clam  Limitations    Clam Limitations  2x10 red band     Other Supine Lumbar Exercises  ball sqeeze 2x10       Manual Therapy   Manual Therapy  Joint mobilization;Myofascial release;Passive ROM;Manual Traction    Joint Mobilization  PA mobilization from L2 -L4; posterior mobilization of both hips     Myofascial Release  Soft tissue mobilziation to lower lumbar spine     Manual Traction  :LAD bilateral  PT Education - 07/24/18 1553    Education Details  HEP and posture     Person(s) Educated  Patient    Methods  Explanation;Demonstration;Tactile cues;Verbal cues    Comprehension  Verbalized understanding;Returned demonstration;Tactile cues required;Verbal cues required       PT Short Term Goals - 07/25/18 1029      PT SHORT TERM GOAL #1   Title  Patient will increase bilateral hamstring lenght by 15 degrees     Baseline  bilateral 40 degree limitations     Time  3    Period  Weeks    Status  On-going      PT SHORT TERM GOAL #2   Title  Patient will report centralized lower back pain < 3/10     Baseline  no raidating today     Time  3    Period  Weeks    Status  On-going      PT SHORT TERM GOAL #3   Title  Patient will be independent with inital HEP     Time  3    Period  Weeks    Status  On-going               Plan - 07/25/18 0939    Clinical Impression Statement  Therapy focused on manual therapy today. Therapy perfreomed PA glides at L2 and L3. He also had tightness with flexion in his hips. Therapy worked on  posterior capsule mobiulization for his hips bt noted little change. Therapy reviewed proper squatting technique. He would like to review his final HEP next visit and [place] discharge.      Clinical Presentation  Evolving    Clinical Decision Making  Moderate    Rehab Potential  Good    PT Frequency  2x / week    PT Duration  8 weeks    PT Treatment/Interventions  ADLs/Self Care Home Management;Electrical Stimulation;Cryotherapy;Iontophoresis 4mg /ml Dexamethasone;Ultrasound;Therapeutic exercise;Therapeutic activities;Gait training;Functional mobility training;Neuromuscular re-education;Patient/family education;Manual techniques;Passive range of motion    PT Next Visit Plan  Finalize HEP; review lifting program. D/C to HEP     PT Home Exercise Plan  posterior pelvic tilt; given tennis ball for trigger point release; lat pull down with TA; Decompression    Consulted and Agree with Plan of Care  Patient       Patient will benefit from skilled therapeutic intervention in order to improve the following deficits and impairments:  Abnormal gait, Pain, Decreased knowledge of precautions, Decreased safety awareness, Decreased strength, Decreased range of motion, Decreased endurance, Impaired sensation  Visit Diagnosis: Chronic bilateral low back pain with bilateral sciatica  Other muscle spasm  Abnormal posture     Problem List Patient Active Problem List   Diagnosis Date Noted  . Elevated BP without diagnosis of hypertension 05/16/2018  . 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 Chronic bilateral low back pain with bilateral sciatica 05/16/2018  . Hyperlipemia 04/24/2017  . Rotator cuff tendinitis, right 04/24/2017  . Type 2 diabetes mellitus ([place]) 04/23/2017    Carney Living PT DPT 07/25/2018, 10:38 AM  [facility] [address] [place], [place], [address] Phone: [phone]   Fax:  [phone]  Name: Brian Diaz MRN: [id number] Date of Birth: Sep 30, 1976

## 2018-07-31 ENCOUNTER — Ambulatory Visit: Payer: Medicaid Other | Attending: Family Medicine | Admitting: Physical Therapy

## 2018-07-31 ENCOUNTER — Encounter: Payer: Self-pay | Admitting: Physical Therapy

## 2018-07-31 DIAGNOSIS — M62838 Other muscle spasm: Secondary | ICD-10-CM | POA: Insufficient documentation

## 2018-07-31 DIAGNOSIS — R293 Abnormal posture: Secondary | ICD-10-CM | POA: Diagnosis present

## 2018-07-31 DIAGNOSIS — M5442 Lumbago with sciatica, left side: Secondary | ICD-10-CM | POA: Diagnosis not present

## 2018-07-31 DIAGNOSIS — G8929 Other chronic pain: Secondary | ICD-10-CM | POA: Diagnosis present

## 2018-07-31 DIAGNOSIS — M5441 Lumbago with sciatica, right side: Secondary | ICD-10-CM | POA: Diagnosis present

## 2018-07-31 NOTE — Patient Instructions (Addendum)
   LUMBAR ROTATION STRETCH  Walk your hands around to the right until you feel a gentle stretch in your low back.  Hold for 5-10 seconds, and then walk your hands around to the left to perform the same stretch.  Repeat 5 times each side, 1-2 times per day or more often if needed for pain control.     Seated Lumbar Flexion  SIT TOWARDS THE EDGE OF THE CHAIR WITH YOUR LEGS APART AND YOUR FEET OUT IN FRONT OF YOUR KNEES.  BEND OVER AND REACH YOUR HANDS DOWN TO THE FLOOR.  HOLD THIS STRETCH FOR 5-10 SECONDS, AND THEN WALK YOUR HANDS UP YOUR LEGS TO HELP BRING YOU BACK TO THE STARTING POSITION.  Repeat 5 times, 1-2 times per day or more if needed for pain control.      SINGLE KNEE TO CHEST STRETCH - SKTC  While lying on your back, use your hands and gently draw up a knee towards your chest.   Keep your other knee straight and lying on the ground.  Hold for 10 seconds, then relax.  Repeat 5 times each side, 1-2 times per day or more if nee    Lumbar Rotations   Lying on your back with your knees bent, slowly drop your legs to one side and hold the stretch. Come back to the middle and switch sides. You should feel the stretch in your back on the opposite side that your legs are leaning.  Hold for 10 seconds, then switch sides.  Repeat 5 times each side, 1-2 times per day or more if needed for pain control.       Self Mulligan Lumbar  Place the strap on the lumbar vertebrae and mobilize by pulling up on the strap.  You may feel a bit of tightness and stiffness, and mild pain in your back. This is OK, but if you feel sharp or shooting pain, or increased numbness, STOP.  You can make this more aggressive by pulling harder on the strap, or by leaning backwards further.  Perform 20 light repetitions, 1-2 times per day.      CHILD POSE - PRAYER STRETCH  While in a crawl position, slowly lower your buttocks towards your feet until a stretch is felt along your back and  or buttocks.  Hold for 10-15 seconds, then walk your hands to the left and hold another 10-15 seconds. Finally, walk your hands around to the right and hold an additional 10-15 seconds.  Repeat this sequence 3 times, 1-2 times per day or more as needed for pain control.    Mad Cat back stretch   On all fours, arch your spine up like an angry cat. Hold this position for 10 seconds, then return to neutral position. Do not worry about arching.  Repeat 10 times, 1-2 times per day or more as needed for pain control.

## 2018-07-31 NOTE — Therapy (Signed)
[place], [place], [address] Phone: [phone]   Fax:  [phone]  Physical Therapy Treatment (DC)  Patient Details  Name: Brian Diaz MRN: [id number] Date of Birth: 1976-08-29 Referring Provider: Dr Sela Hilding   Encounter Date: 07/31/2018   PHYSICAL THERAPY DISCHARGE SUMMARY  Visits from Start of Care: 4  Current functional level related to goals / functional outcomes: DC to advanced HEP per patient request    Remaining deficits: No significant change since eval in pain or QOL    Education / Equipment: See below  Plan: Patient agrees to discharge.  Patient goals were partially met. Patient is being discharged due to the patient's request.  ?????       PT End of Session - 07/31/18 1410    Visit Number  4    Number of Visits  4    Date for PT Re-Evaluation  08/01/18    Authorization Type  [employer]     PT Start Time  1330    PT Stop Time  1408    PT Time Calculation (min)  38 min    Activity Tolerance  Patient tolerated treatment well    Behavior During Therapy  WFL for tasks assessed/performed       Past Medical History:  Diagnosis Date  . Diabetes mellitus without complication ([place])   . Fatty liver   . H. pylori infection   . Hyperlipidemia   . Obesity (BMI 30-39.9)   . Pancreatic insufficiency     Past Surgical History:  Procedure Laterality Date  . TOOTH EXTRACTION  2009    There were no vitals filed for this visit.  Subjective Assessment - 07/31/18 1333    Subjective  I would still like to make today my last day; I am trying to get back to work and have scheduling issues with therapy. My back is not much better, there's good and bad days but I don't notice much of a change overall. I'm getting a lot of stretches for the legs, but I'd like something that would target my low back. The leg stretches made my legs feel better but have not changed my back. My upper  back still gets tight but it has not spasmed, I've been able to stretch it out and keep it from spasming.     Patient Stated Goals  to have less pain in his back and be able to lift things at work     Currently in Pain?  Yes    Pain Score  3     Pain Location  Back    Pain Orientation  Right;Left;Lower    Pain Descriptors / Indicators  Aching;Sharp;Dull    Pain Type  Chronic pain    Pain Radiating Towards  down both legs down to mid calf     Pain Onset  More than a month ago    Pain Frequency  Constant    Aggravating Factors   bending to pick something up, simple things like cutting the grass     Pain Relieving Factors  icy hot patches, leg stretches to a point     Effect of Pain on Daily Activities  unable to perform work tasks                        [facility] Adult PT Treatment/Exercise - 07/31/18 0001      Exercises   Exercises  Lumbar  Lumbar Exercises: Stretches   Single Knee to Chest Stretch  5 reps;10 seconds    Double Knee to Chest Stretch  5 reps;10 seconds    Lower Trunk Rotation  5 reps;10 seconds    Hip Flexor Stretch  --    Pelvic Tilt  --    Other Lumbar Stretch Exercise  seated lumbar flexion stretch 5x5 seconds; lumbar rotation stretch 5x5 seconds B       Lumbar Exercises: Standing   Other Standing Lumbar Exercises  practiced self-lumbar mobiliziations with towel x20      Lumbar Exercises: Quadruped   Madcat/Old Horse  10 reps    Other Quadruped Lumbar Exercises  child's pose stretch 5x10 seconds; discussed progresisons              PT Education - 07/31/18 1409    Education Details  HEP updates, DC today, may return to PT with new MD order     Person(s) Educated  Patient    Methods  Explanation;Demonstration;Handout    Comprehension  Verbalized understanding;Returned demonstration       PT Short Term Goals - 07/25/18 1029      PT SHORT TERM GOAL #1   Title  Patient will increase bilateral hamstring lenght by 15 degrees      Baseline  bilateral 40 degree limitations     Time  3    Period  Weeks    Status  On-going      PT SHORT TERM GOAL #2   Title  Patient will report centralized lower back pain < 3/10     Baseline  no raidating today     Time  3    Period  Weeks    Status  On-going      PT SHORT TERM GOAL #3   Title  Patient will be independent with inital HEP     Time  3    Period  Weeks    Status  On-going               Plan - 07/31/18 1410    Clinical Impression Statement  Patient arrives today continuing to request that today be his last day. Focused today's session on review and HEP assignment of lumbar mobility exercises per patient request, with HEP updates provided as appropriate and as tolerated by patient. Discharge today per patient request.     Rehab Potential  Good    PT Next Visit Plan  DC today     PT Home Exercise Plan  posterior pelvic tilt; given tennis ball for trigger point release; lat pull down with TA; Decompression; SKTC, lumbar rotation, seated lumbar stretch, seated lumbar rotation stretch, child's pose, quadruped arching, self lumbar mobilizations with sheet     Consulted and Agree with Plan of Care  Patient       Patient will benefit from skilled therapeutic intervention in order to improve the following deficits and impairments:  Abnormal gait, Pain, Decreased knowledge of precautions, Decreased safety awareness, Decreased strength, Decreased range of motion, Decreased endurance, Impaired sensation  Visit Diagnosis: Chronic bilateral low back pain with bilateral sciatica  Other muscle spasm  Abnormal posture     Problem List Patient Active Problem List   Diagnosis Date Noted  . Elevated BP without diagnosis of hypertension 05/16/2018  . 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 Chronic bilateral low back pain with bilateral sciatica 05/16/2018  . Hyperlipemia 04/24/2017  . Rotator cuff tendinitis, right 04/24/2017  . Type 2 diabetes mellitus  (  [place]) 04/23/2017    Deniece Ree PT, DPT, [place]  Supplemental Physical Therapist [place]   Pager [place] [facility] [address] [place], [place], [address] Phone: [phone]   Fax:  [phone]  Name: Brian Diaz MRN: [id number] Date of Birth: 31-Oct-1976

## 2018-08-22 ENCOUNTER — Encounter: Payer: Self-pay | Admitting: Gastroenterology

## 2018-08-22 ENCOUNTER — Ambulatory Visit (AMBULATORY_SURGERY_CENTER): Payer: Medicaid Other | Admitting: Gastroenterology

## 2018-08-22 VITALS — BP 101/64 | HR 82 | Temp 97.1°F | Resp 17 | Ht 68.0 in | Wt 231.0 lb

## 2018-08-22 DIAGNOSIS — D127 Benign neoplasm of rectosigmoid junction: Secondary | ICD-10-CM

## 2018-08-22 DIAGNOSIS — D126 Benign neoplasm of colon, unspecified: Secondary | ICD-10-CM

## 2018-08-22 DIAGNOSIS — K635 Polyp of colon: Secondary | ICD-10-CM | POA: Diagnosis not present

## 2018-08-22 DIAGNOSIS — D123 Benign neoplasm of transverse colon: Secondary | ICD-10-CM

## 2018-08-22 DIAGNOSIS — K921 Melena: Secondary | ICD-10-CM | POA: Diagnosis not present

## 2018-08-22 DIAGNOSIS — D122 Benign neoplasm of ascending colon: Secondary | ICD-10-CM

## 2018-08-22 MED ORDER — SODIUM CHLORIDE 0.9 % IV SOLN: 500.0000 mL | Freq: Once | INTRAVENOUS | Status: DC

## 2018-08-22 NOTE — Patient Instructions (Signed)
**   Handouts given on polyps, diverticulosis, and hemorrhoids **   YOU HAD AN ENDOSCOPIC PROCEDURE TODAY AT THE [facility]:   Refer to the procedure report that was given to you for any specific questions about what was found during the examination.  If the procedure report does not answer your questions, please call your gastroenterologist to clarify.  If you requested that your care partner not be given the details of your procedure findings, then the procedure report has been included in a sealed envelope for you to review at your convenience later.  YOU SHOULD EXPECT: Some feelings of bloating in the abdomen. Passage of more gas than usual.  Walking can help get rid of the air that was put into your GI tract during the procedure and reduce the bloating. If you had a lower endoscopy (such as a colonoscopy or flexible sigmoidoscopy) you may notice spotting of blood in your stool or on the toilet paper. If you underwent a bowel prep for your procedure, you may not have a normal bowel movement for a few days.  Please Note:  You might notice some irritation and congestion in your nose or some drainage.  This is from the oxygen used during your procedure.  There is no need for concern and it should clear up in a day or so.  SYMPTOMS TO REPORT IMMEDIATELY:   Following lower endoscopy (colonoscopy or flexible sigmoidoscopy):  Excessive amounts of blood in the stool  Significant tenderness or worsening of abdominal pains  Swelling of the abdomen that is new, acute  Fever of 100F or higher  For urgent or emergent issues, a gastroenterologist can be reached at any hour by calling [phone].   DIET:  We do recommend a small meal at first, but then you may proceed to your regular diet.  Drink plenty of fluids but you should avoid alcoholic beverages for 24 hours.  ACTIVITY:  You should plan to take it easy for the rest of today and you should NOT DRIVE or use heavy machinery until  tomorrow (because of the sedation medicines used during the test).    FOLLOW UP: Our staff will call the number listed on your records the next business day following your procedure to check on you and address any questions or concerns that you may have regarding the information given to you following your procedure. If we do not reach you, we will leave a message.  However, if you are feeling well and you are not experiencing any problems, there is no need to return our call.  We will assume that you have returned to your regular daily activities without incident.  If any biopsies were taken you will be contacted by phone or by letter within the next 1-3 weeks.  Please call us at [phone] if you have not heard about the biopsies in 3 weeks.    SIGNATURES/CONFIDENTIALITY: You and/or your care partner have signed paperwork which will be entered into your electronic medical record.  These signatures attest to the fact that that the information above on your After Visit Summary has been reviewed and is understood.  Full responsibility of the confidentiality of this discharge information lies with you and/or your care-partner. 

## 2018-08-22 NOTE — Op Note (Addendum)
[facility] Patient Name: Brian Diaz Procedure Date: 08/22/2018 8:42 AM MRN: [id number] Endoscopist: Viviann Spare P. Adela Lank , MD Age: 42 Referring MD:  Date of Birth: Apr 24, 1976 Gender: Male Account #: [id number] Procedure:                Colonoscopy Indications:              This is the patient's first colonoscopy,                            Hematochezia Medicines:                Monitored Anesthesia Care Procedure:                Pre-Anesthesia Assessment:                           - Prior to the procedure, a History and Physical                            was performed, and patient medications and                            allergies were reviewed. The patient's tolerance of                            previous anesthesia was also reviewed. The risks                            and benefits of the procedure and the sedation                            options and risks were discussed with the patient.                            All questions were answered, and informed consent                            was obtained. Prior Anticoagulants: The patient has                            taken no previous anticoagulant or antiplatelet                            agents. ASA Grade Assessment: II - A patient with                            mild systemic disease. After reviewing the risks                            and benefits, the patient was deemed in                            satisfactory condition to undergo the procedure.  After obtaining informed consent, the colonoscope                            was passed under direct vision. Throughout the                            procedure, the patient's blood pressure, pulse, and                            oxygen saturations were monitored continuously. The                            Colonoscope was introduced through the anus and                            advanced to the the terminal ileum, with                    identification of the appendiceal orifice and IC                            valve. The colonoscopy was performed without                            difficulty. The patient tolerated the procedure                            well. The quality of the bowel preparation was                            good. The terminal ileum, ileocecal valve,                            appendiceal orifice, and rectum were photographed. Scope In: 8:47:04 AM Scope Out: 9:04:00 AM Scope Withdrawal Time: 0 hours 13 minutes 56 seconds  Total Procedure Duration: 0 hours 16 minutes 56 seconds  Findings:                 The perianal and digital rectal examinations were                            normal.                           The terminal ileum appeared normal.                           A 3 mm polyp was found in the ascending colon. The                            polyp was sessile. The polyp was removed with a                            cold snare. Resection and retrieval were complete.  A 4 mm polyp was found in the transverse colon. The                            polyp was sessile. The polyp was removed with a                            cold snare. Resection and retrieval were complete.                           A 5 mm polyp was found in the recto-sigmoid colon.                            The polyp was sessile. The polyp was removed with a                            cold snare. Resection and retrieval were complete.                           Scattered medium-mouthed diverticula were found in                            the transverse colon, ascending colon and left                            colon, highest burden in sigmoid colon.                           Internal hemorrhoids were found during retroflexion.                           The exam was otherwise without abnormality. Of                            note, the IC valve was normal, photo not taken                             accidentally. Complications:            No immediate complications. Estimated blood loss:                            Minimal. Estimated Blood Loss:     Estimated blood loss was minimal. Impression:               - The examined portion of the ileum was normal.                           - One 3 mm polyp in the ascending colon, removed                            with a cold snare. Resected and retrieved.                           - One 4 mm polyp  in the transverse colon, removed                            with a cold snare. Resected and retrieved.                           - One 5 mm polyp at the recto-sigmoid colon,                            removed with a cold snare. Resected and retrieved.                           - Diverticulosis in the transverse colon, in the                            ascending colon and in the left colon.                           - Internal hemorrhoids.                           - The examination was otherwise normal.                           Suspect bleeding symptoms are due to hemorrhoids                            noted on this exam Recommendation:           - Patient has a contact number available for                            emergencies. The signs and symptoms of potential                            delayed complications were discussed with the                            patient. Return to normal activities tomorrow.                            Written discharge instructions were provided to the                            patient.                           - Resume previous diet.                           - Continue present medications.                           - Await pathology results.                           -  Repeat colonoscopy for surveillance based on                            pathology results.                           - Daily fiber supplement to treat hemorrhoids. If                            symptoms persist, consideration for  hemorrhoid                            banding Brian Diaz. Brian Kohn, MD 08/22/2018 9:08:30 AM This report has been signed electronically.

## 2018-08-22 NOTE — Progress Notes (Signed)
Called to room to assist during endoscopic procedure.  Patient ID and intended procedure confirmed with present staff. Received instructions for my participation in the procedure from the performing physician.  

## 2018-08-22 NOTE — Progress Notes (Signed)
Pt's states no medical or surgical changes since previsit or office visit. 

## 2018-08-22 NOTE — Progress Notes (Signed)
To PACU Pt awake and alert. Report to RN 

## 2018-08-27 ENCOUNTER — Telehealth: Payer: Self-pay | Admitting: *Deleted

## 2018-08-27 NOTE — Telephone Encounter (Signed)
  Follow up Call-  Call back number 08/22/2018 02/15/2017  Post procedure Call Back phone  # [phone]  Permission to leave phone message Yes Yes  Some recent data might be hidden     Patient questions:  Do you have a fever, pain , or abdominal swelling? No. Pain Score  0 *  Have you tolerated food without any problems? Yes.    Have you been able to return to your normal activities? Yes.    Do you have any questions about your discharge instructions: Diet   No. Medications  No. Follow up visit  No.  Do you have questions or concerns about your Care? No.  Actions: * If pain score is 4 or above: No action needed, pain <4.

## 2019-03-11 ENCOUNTER — Other Ambulatory Visit: Payer: Self-pay

## 2019-03-11 ENCOUNTER — Ambulatory Visit (HOSPITAL_COMMUNITY)
Admission: EM | Admit: 2019-03-11 | Discharge: 2019-03-11 | Disposition: A | Discharge status: Home / Self Care | Payer: Medicaid Other | Attending: Family Medicine | Admitting: Family Medicine

## 2019-03-11 ENCOUNTER — Encounter (HOSPITAL_COMMUNITY): Payer: Self-pay

## 2019-03-11 DIAGNOSIS — J069 Acute upper respiratory infection, unspecified: Secondary | ICD-10-CM

## 2019-03-11 DIAGNOSIS — B9789 Other viral agents as the cause of diseases classified elsewhere: Secondary | ICD-10-CM

## 2019-03-11 MED ORDER — BENZONATATE 200 MG PO CAPS: 200.0000 mg | ORAL_CAPSULE | Freq: Three times a day (TID) | ORAL | 0 refills | Status: AC | PRN

## 2019-03-11 NOTE — Discharge Instructions (Signed)
You likely having a viral upper respiratory infection. We recommended symptom control. I expect your symptoms to start improving in the next 1-2 weeks.   1. Take a daily allergy pill/anti-histamine like Zyrtec, Claritin, or Store brand consistently for 2 weeks  2. For congestion you may try an oral decongestant like Mucinex or sudafed. You may also try intranasal flonase nasal spray or saline irrigations (neti pot, sinus cleanse)  3. For your sore throat you may try cepacol lozenges, salt water gargles, throat spray. Treatment of congestion may also help your sore throat.  4. For cough you may try Robitussen, Mucinex DM  5. Take Tylenol or Ibuprofen to help with pain/inflammation  6. Stay hydrated, drink plenty of fluids to keep throat coated and less irritated  Honey Tea For cough/sore throat try using a honey-based tea. Use 3 teaspoons of honey with juice squeezed from half lemon. Place shaved pieces of ginger into 1/2-1 cup of water and warm over stove top. Then mix the ingredients and repeat every 4 hours as needed.

## 2019-03-11 NOTE — ED Provider Notes (Signed)
[name]    CSN: [id number] Arrival date & time: 03/11/19  1062     History   Chief Complaint Chief Complaint  Patient presents with  . Nasal Congestion  . Chest Pain    HPI Brian Diaz is a 43 y.o. male history of DM type II, hyperlipidemia, presenting today for evaluation of congestion and chest tightness.  Patient states that he has had drainage, chills and sore throat.  Symptoms began approximately 2 days ago on Sunday.  He is noticed some slight discomfort centrally in his chest only with taking deep breaths, denies at rest or with normal breathing.  He is tried cough drops as well as Coricidin with out relief.  Denies any known fevers.Denies any recent travel, denies any known exposure to COVID-19.    HPI  Past Medical History:  Diagnosis Date  . Diabetes mellitus without complication ([place])   . Fatty liver   . H. pylori infection   . Hyperlipidemia   . Obesity (BMI 30-39.9)   . Pancreatic insufficiency     Patient Active Problem List   Diagnosis Date Noted  . Elevated BP without diagnosis of hypertension 05/16/2018  . 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 Chronic bilateral low back pain with bilateral sciatica 05/16/2018  . Hyperlipemia 04/24/2017  . Rotator cuff tendinitis, right 04/24/2017  . Type 2 diabetes mellitus ([place]) 04/23/2017    Past Surgical History:  Procedure Laterality Date  . TOOTH EXTRACTION  2009       Home Medications    Prior to Admission medications   Medication Sig Start Date End Date Taking? Authorizing Provider  atorvastatin (LIPITOR) 20 MG tablet Take 2 tablets (40 mg total) by mouth daily. 04/24/17   Eloise Levels, MD  benzonatate (TESSALON) 200 MG capsule Take 1 capsule (200 mg total) by mouth 3 (three) times daily as needed for up to 7 days for cough. 03/11/19 03/18/19  Jaynee Winters C, PA-C  cyclobenzaprine (FLEXERIL) 10 MG tablet Take 1 tablet (10 mg total) by mouth 3 (three) times daily as  needed for muscle spasms. 06/19/18   Sela Hilding, MD  metFORMIN (GLUCOPHAGE) 1000 MG tablet Take 0.5 tablets (500 mg total) by mouth 2 (two) times daily with a meal. 05/14/18   Sela Hilding, MD  MULTIPLE VITAMIN PO Take 1 tablet by mouth daily.    [provider]  Pancrelipase, Lip-Prot-Amyl, (ZENPEP) 25000 units CPEP Take 2 capsules by mouth as directed. Take 50,000 units 3 times daily before meals. 09/05/17   Armbruster, Carlota Raspberry, MD    Family History Family History  Problem Relation Age of Onset  . Heart disease Mother   . COPD Mother   . Prostate cancer Maternal Grandfather   . Colon cancer Neg Hx   . Stomach cancer Neg Hx   . Esophageal cancer Neg Hx     Social History Social History   Tobacco Use  . Smoking status: Former Smoker    Types: Cigarettes    Last attempt to quit: 09/27/2007    Years since quitting: 11.4  . Smokeless tobacco: Never Used  Substance Use Topics  . Alcohol use: Yes    Comment: rare  . Drug use: No     Allergies   Patient has no known allergies.   Review of Systems Review of Systems  Constitutional: Negative for activity change, appetite change, chills, fatigue and fever.  HENT: Positive for congestion, rhinorrhea and sore throat. Negative for ear pain, sinus pressure and trouble  swallowing.   Eyes: Negative for discharge and redness.  Respiratory: Positive for cough and chest tightness. Negative for shortness of breath.   Cardiovascular: Negative for chest pain.  Gastrointestinal: Negative for abdominal pain, diarrhea, nausea and vomiting.  Musculoskeletal: Negative for myalgias.  Skin: Negative for rash.  Neurological: Negative for dizziness, light-headedness and headaches.     Physical Exam Triage Vital Signs ED Triage Vitals  Enc Vitals Group     BP 03/11/19 0912 (!) 140/100     Pulse Rate 03/11/19 0912 78     Resp 03/11/19 0912 16     Temp 03/11/19 0912 98.2 F (36.8 C)     Temp Source 03/11/19 0912 Oral      SpO2 03/11/19 0912 95 %     Weight --      Height --      Head Circumference --      Peak Flow --      Pain Score 03/11/19 0916 5     Pain Loc --      Pain Edu? --      Excl. in [place]? --    No data found.  Updated Vital Signs BP (!) 140/100 (BP Location: Right Arm)   Pulse 78   Temp 98.2 F (36.8 C) (Oral)   Resp 16   SpO2 95%   Visual Acuity Right Eye Distance:   Left Eye Distance:   Bilateral Distance:    Right Eye Near:   Left Eye Near:    Bilateral Near:     Physical Exam Vitals signs and nursing note reviewed.  Constitutional:      Appearance: He is well-developed.  HENT:     Head: Normocephalic and atraumatic.     Ears:     Comments: Bilateral ears without tenderness to palpation of external auricle, tragus and mastoid, EAC's without erythema or swelling, TM's with good bony landmarks and cone of light. Non erythematous.     Mouth/Throat:     Comments: Oral mucosa pink and moist, no tonsillar enlargement or exudate. Posterior pharynx patent and nonerythematous, no uvula deviation or swelling. Normal phonation. Eyes:     Conjunctiva/sclera: Conjunctivae normal.  Neck:     Musculoskeletal: Neck supple.  Cardiovascular:     Rate and Rhythm: Normal rate and regular rhythm.     Heart sounds: No murmur.  Pulmonary:     Effort: Pulmonary effort is normal. No respiratory distress.     Breath sounds: Normal breath sounds.     Comments: Breathing comfortably at rest, CTABL, no wheezing, rales or other adventitious sounds auscultated Abdominal:     Palpations: Abdomen is soft.     Tenderness: There is no abdominal tenderness.  Skin:    General: Skin is warm and dry.  Neurological:     Mental Status: He is alert.      UC Treatments / Results  Labs (all labs ordered are listed, but only abnormal results are displayed) Labs Reviewed - No data to display  EKG None  Radiology No results found.  Procedures Procedures (including critical care time)   Medications Ordered in UC Medications - No data to display  Initial Impression / Assessment and Plan / UC Course  I have reviewed the triage vital signs and the nursing notes.  Pertinent labs & imaging results that were available during my care of the patient were reviewed by me and considered in my medical decision making (see chart for details).     Viral URI symptoms  x2 days, vital signs stable, lungs clear, exam nonfocal.  Most likely viral etiology, will recommend symptomatic and supportive care.  Recommendations below.  Tessalon as needed for cough.  Discomfort in chest most likely more musculoskeletal/chest wall.  Continue to monitor,Discussed strict return precautions. Patient verbalized understanding and is agreeable with plan.  Final Clinical Impressions(s) / UC Diagnoses   Final diagnoses:  Viral URI with cough     Discharge Instructions     You likely having a viral upper respiratory infection. We recommended symptom control. I expect your symptoms to start improving in the next 1-2 weeks.   1. Take a daily allergy pill/anti-histamine like Zyrtec, Claritin, or Store brand consistently for 2 weeks  2. For congestion you may try an oral decongestant like Mucinex or sudafed. You may also try intranasal flonase nasal spray or saline irrigations (neti pot, sinus cleanse)  3. For your sore throat you may try cepacol lozenges, salt water gargles, throat spray. Treatment of congestion may also help your sore throat.  4. For cough you may try Robitussen, Mucinex DM  5. Take Tylenol or Ibuprofen to help with pain/inflammation  6. Stay hydrated, drink plenty of fluids to keep throat coated and less irritated  Honey Tea For cough/sore throat try using a honey-based tea. Use 3 teaspoons of honey with juice squeezed from half lemon. Place shaved pieces of ginger into 1/2-1 cup of water and warm over stove top. Then mix the ingredients and repeat every 4 hours as needed.   ED  Prescriptions    Medication Sig Dispense Auth. Provider   benzonatate (TESSALON) 200 MG capsule Take 1 capsule (200 mg total) by mouth 3 (three) times daily as needed for up to 7 days for cough. 28 capsule Alane Hanssen C, PA-C     Controlled Substance Prescriptions [place] Controlled Substance Registry consulted? Not Applicable   Janith Lima, [place] 03/11/19 1700

## 2019-03-11 NOTE — ED Triage Notes (Signed)
Pt present tighness in his chest when he takes a deep breath, pt also present some nasal drainage with congestion and facial pain.  Symptoms started about two days ago.  Pt states his sore throat has cleared up

## 2019-06-10 ENCOUNTER — Other Ambulatory Visit: Payer: Self-pay | Admitting: Family Medicine

## 2019-06-11 NOTE — Telephone Encounter (Signed)
I will send one month of refills. Please call patient and schedule him for a meet new PCP/recheck DM visit with Dr. Sandi Carne when it is convenient for him. He has a recent hx of pancreatic problems that would affect his diabetes management and needs labs rechecked at next visit. I will route to Dr. Sandi Carne who will be his new PCP as FYI.

## 2019-06-16 NOTE — Telephone Encounter (Signed)
LVM to call office back to inform him of below and to assist in getting him an appointment for his diabetes with his new PCP.  Please assist pt in getting this scheduled. April Zimmerman Rumple, CMA

## 2019-06-18 NOTE — Telephone Encounter (Signed)
2nd attempt to reach pt.  LVM to call office back to inform him of below and to assist him in getting an appointment scheduled.Brian Diaz, CMA

## 2019-06-19 NOTE — Telephone Encounter (Signed)
3rd attempt to contact pt, sent my chart message asking pt to call office to schedule and appointment and meet new PCP. April Zimmerman Rumple, CMA

## 2020-04-03 ENCOUNTER — Ambulatory Visit: Payer: Self-pay | Attending: Internal Medicine

## 2020-04-03 DIAGNOSIS — Z23 Encounter for immunization: Secondary | ICD-10-CM

## 2020-04-03 NOTE — Progress Notes (Signed)
   Covid-19 Vaccination Clinic  Name:  Brian Diaz    MRN: [id number] DOB: 26-Nov-1976  04/03/2020  Mr. Ray was observed post Covid-19 immunization for 15 minutes without incident. He was provided with Vaccine Information Sheet and instruction to access the V-Safe system.   [name] was instructed to call 911 with any severe reactions post vaccine: Marland Kitchen Difficulty breathing  . Swelling of face and throat  . A fast heartbeat  . A bad rash all over body  . Dizziness and weakness   Immunizations Administered    Name Date Dose VIS Date Route   Pfizer COVID-19 Vaccine 04/03/2020  8:33 AM 0.3 mL 12/05/2019 Intramuscular   Manufacturer: [place]   Lot: [id number]   [place]: [id number]

## 2020-04-27 ENCOUNTER — Ambulatory Visit: Payer: Self-pay | Attending: Internal Medicine

## 2020-04-27 DIAGNOSIS — Z23 Encounter for immunization: Secondary | ICD-10-CM

## 2020-04-27 NOTE — Progress Notes (Signed)
   Covid-19 Vaccination Clinic  Name:  Mckinley Clinton    MRN: [id number] DOB: Nov 05, 1976  04/27/2020  Mr. Ante was observed post Covid-19 immunization for 30 minutes based on pre-vaccination screening without incident. He was provided with Vaccine Information Sheet and instruction to access the V-Safe system.   Mr. Bitner was instructed to call 911 with any severe reactions post vaccine: Marland Kitchen Difficulty breathing  . Swelling of face and throat  . A fast heartbeat  . A bad rash all over body  . Dizziness and weakness   Immunizations Administered    Name Date Dose VIS Date Route   Pfizer COVID-19 Vaccine 04/27/2020  9:07 AM 0.3 mL 02/18/2019 Intramuscular   Manufacturer: [place]   Lot: [id number]   [place]: [id number]

## 2020-06-22 ENCOUNTER — Ambulatory Visit (HOSPITAL_COMMUNITY)
Admission: EM | Admit: 2020-06-22 | Discharge: 2020-06-22 | Disposition: A | Discharge status: Home / Self Care | Payer: Managed Care, Other (non HMO) | Attending: Nurse Practitioner | Admitting: Nurse Practitioner

## 2020-06-22 ENCOUNTER — Encounter (HOSPITAL_COMMUNITY): Payer: Self-pay

## 2020-06-22 ENCOUNTER — Other Ambulatory Visit: Payer: Self-pay

## 2020-06-22 DIAGNOSIS — M545 Low back pain, unspecified: Secondary | ICD-10-CM

## 2020-06-22 DIAGNOSIS — M62830 Muscle spasm of back: Secondary | ICD-10-CM | POA: Insufficient documentation

## 2020-06-22 MED ORDER — DICLOFENAC SODIUM 50 MG PO TBEC: 50.0000 mg | DELAYED_RELEASE_TABLET | Freq: Three times a day (TID) | ORAL | 0 refills | Status: AC

## 2020-06-22 MED ORDER — KETOROLAC TROMETHAMINE 60 MG/2ML IM SOLN
60.0000 mg | Freq: Once | INTRAMUSCULAR | Status: AC
  Administered 2020-06-22: 60 mg via INTRAMUSCULAR

## 2020-06-22 MED ORDER — METHOCARBAMOL 500 MG PO TABS: 500.0000 mg | ORAL_TABLET | Freq: Three times a day (TID) | ORAL | 0 refills | Status: DC

## 2020-06-22 MED ORDER — METFORMIN HCL 500 MG PO TABS: 500.0000 mg | ORAL_TABLET | Freq: Every day | ORAL | 0 refills | Status: AC

## 2020-06-22 MED ORDER — KETOROLAC TROMETHAMINE 60 MG/2ML IM SOLN
INTRAMUSCULAR | Status: AC
  Filled 2020-06-22: qty 2

## 2020-06-22 MED ORDER — METHYLPREDNISOLONE SODIUM SUCC 125 MG IJ SOLR
80.0000 mg | Freq: Once | INTRAMUSCULAR | Status: AC
  Administered 2020-06-22: 80 mg via INTRAMUSCULAR

## 2020-06-22 MED ORDER — PREDNISONE 20 MG PO TABS: 40.0000 mg | ORAL_TABLET | Freq: Every day | ORAL | 0 refills | Status: AC

## 2020-06-22 MED ORDER — METHYLPREDNISOLONE SODIUM SUCC 125 MG IJ SOLR
INTRAMUSCULAR | Status: AC
  Filled 2020-06-22: qty 2

## 2020-06-22 NOTE — ED Triage Notes (Signed)
Pt presents with recurrent severe back pain since yesterday.
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
## 2020-06-23 LAB — HEMOGLOBIN A1C
Hgb A1c MFr Bld: 6.2 % — ABNORMAL HIGH (ref 4.8–5.6)
Mean Plasma Glucose: 131 mg/dL

## 2021-07-28 ENCOUNTER — Other Ambulatory Visit (HOSPITAL_BASED_OUTPATIENT_CLINIC_OR_DEPARTMENT_OTHER): Payer: Self-pay

## 2021-07-28 MED ORDER — PFIZER-BIONT COVID-19 VAC-TRIS 30 MCG/0.3ML IM SUSP
INTRAMUSCULAR | 0 refills | Status: AC
  Filled 2021-07-28: qty 0.3, 1d supply, fill #0

## 2021-09-08 ENCOUNTER — Emergency Department (HOSPITAL_BASED_OUTPATIENT_CLINIC_OR_DEPARTMENT_OTHER)
Admission: EM | Admit: 2021-09-08 | Discharge: 2021-09-08 | Disposition: A | Discharge status: Home / Self Care | Payer: Managed Care, Other (non HMO) | Attending: Emergency Medicine | Admitting: Emergency Medicine

## 2021-09-08 ENCOUNTER — Other Ambulatory Visit: Payer: Self-pay

## 2021-09-08 ENCOUNTER — Encounter (HOSPITAL_BASED_OUTPATIENT_CLINIC_OR_DEPARTMENT_OTHER): Payer: Self-pay | Admitting: *Deleted

## 2021-09-08 DIAGNOSIS — A09 Infectious gastroenteritis and colitis, unspecified: Secondary | ICD-10-CM | POA: Diagnosis not present

## 2021-09-08 DIAGNOSIS — F17210 Nicotine dependence, cigarettes, uncomplicated: Secondary | ICD-10-CM | POA: Diagnosis not present

## 2021-09-08 DIAGNOSIS — J029 Acute pharyngitis, unspecified: Secondary | ICD-10-CM

## 2021-09-08 DIAGNOSIS — Z20822 Contact with and (suspected) exposure to covid-19: Secondary | ICD-10-CM | POA: Insufficient documentation

## 2021-09-08 DIAGNOSIS — Z79899 Other long term (current) drug therapy: Secondary | ICD-10-CM | POA: Diagnosis not present

## 2021-09-08 DIAGNOSIS — E119 Type 2 diabetes mellitus without complications: Secondary | ICD-10-CM | POA: Diagnosis not present

## 2021-09-08 DIAGNOSIS — R197 Diarrhea, unspecified: Secondary | ICD-10-CM

## 2021-09-08 DIAGNOSIS — J069 Acute upper respiratory infection, unspecified: Secondary | ICD-10-CM | POA: Diagnosis not present

## 2021-09-08 LAB — COMPREHENSIVE METABOLIC PANEL
ALT: 21 U/L (ref 0–44)
AST: 19 U/L (ref 15–41)
Albumin: 4.7 g/dL (ref 3.5–5.0)
Alkaline Phosphatase: 74 U/L (ref 38–126)
Anion gap: 12 (ref 5–15)
BUN: 9 mg/dL (ref 6–20)
CO2: 24 mmol/L (ref 22–32)
Calcium: 9.7 mg/dL (ref 8.9–10.3)
Chloride: 102 mmol/L (ref 98–111)
Creatinine, Ser: 0.94 mg/dL (ref 0.61–1.24)
GFR, Estimated: >60 mL/min (ref >60)
Glucose, Bld: 76 mg/dL (ref 70–99)
Potassium: 3.5 mmol/L (ref 3.5–5.1)
Sodium: 138 mmol/L (ref 135–145)
Total Bilirubin: 0.6 mg/dL (ref 0.3–1.2)
Total Protein: 8.2 g/dL — ABNORMAL HIGH (ref 6.5–8.1)

## 2021-09-08 LAB — CBC WITH DIFFERENTIAL/PLATELET
Abs Immature Granulocytes: 0.01 10*3/uL (ref 0.00–0.07)
Basophils Absolute: 0.1 10*3/uL (ref 0.0–0.1)
Basophils Relative: 2 %
Eosinophils Absolute: 0.3 10*3/uL (ref 0.0–0.5)
Eosinophils Relative: 5 %
HCT: 49.4 % (ref 39.0–52.0)
Hemoglobin: 16.3 g/dL (ref 13.0–17.0)
Immature Granulocytes: 0 %
Lymphocytes Relative: 33 %
Lymphs Abs: 1.6 10*3/uL (ref 0.7–4.0)
MCH: 28.2 pg (ref 26.0–34.0)
MCHC: 33 g/dL (ref 30.0–36.0)
MCV: 85.5 fL (ref 80.0–100.0)
Monocytes Absolute: 0.9 10*3/uL (ref 0.1–1.0)
Monocytes Relative: 20 %
Neutro Abs: 1.9 10*3/uL (ref 1.7–7.7)
Neutrophils Relative %: 40 %
Platelets: 226 10*3/uL (ref 150–400)
RBC: 5.78 MIL/uL (ref 4.22–5.81)
RDW: 13.2 % (ref 11.5–15.5)
WBC: 4.8 10*3/uL (ref 4.0–10.5)
nRBC: 0 % (ref 0.0–0.2)

## 2021-09-08 LAB — RESP PANEL BY RT-PCR (FLU A&B, COVID) ARPGX2
Influenza A by PCR: NEGATIVE
Influenza B by PCR: NEGATIVE
SARS Coronavirus 2 by RT PCR: NEGATIVE

## 2021-09-08 MED ORDER — SODIUM CHLORIDE 0.9 % IV BOLUS
1000.0000 mL | Freq: Once | INTRAVENOUS | Status: AC
  Administered 2021-09-08: 1000 mL via INTRAVENOUS

## 2021-09-08 NOTE — ED Triage Notes (Signed)
Sore throat, Headache from coughing and nasal congestion and diarrhea.  Symptoms started Tuesday night.  Pt stated that he has chills all day Wednesday.

## 2021-09-08 NOTE — ED Provider Notes (Signed)
[facility] EMERGENCY DEPT Provider Note   CSN: [id number] Arrival date & time: 09/08/21  0915     History Chief Complaint  Patient presents with   Sore Throat   Nasal Congestion    Brian Diaz is a 45 y.o. male.  HPI      Tuesday night had congestion, sore throat, coughing, had a lot of coughing with associated pain in chest Wednesday continued coughing, chills Midnight last night diarrhea Light twinge of headache, nervous jittery feeling, slight pain Last night felt lightheaded at work Son sick this past weekend   Past Medical History:  Diagnosis Date   Diabetes mellitus without complication ([place])    Fatty liver    H. 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/16/2018   Hyperlipemia 04/24/2017   Rotator cuff tendinitis, right 04/24/2017   Type 2 diabetes mellitus ([place]) 04/23/2017    Past Surgical History:  Procedure Laterality Date   TOOTH EXTRACTION  2009       Family History  Problem Relation Age of Onset   Heart disease Mother    COPD Mother    Prostate cancer Maternal Grandfather    Colon cancer Neg Hx    Stomach cancer Neg Hx    Esophageal cancer Neg Hx     Social History   Tobacco Use   Smoking status: Every Day    Types: Cigarettes    Last attempt to quit: 09/27/2007    Years since quitting: 13.9   Smokeless tobacco: Never  Vaping Use   Vaping Use: Never used  Substance Use Topics   Alcohol use: Yes    Comment: rare   Drug use: No    Home Medications Prior to Admission medications   Medication Sig Start Date End Date Taking? Authorizing Provider  atorvastatin (LIPITOR) 20 MG tablet Take 2 tablets (40 mg total) by mouth daily. Patient not  taking: No sig reported 04/24/17   Eloise Levels, MD  COVID-19 mRNA Vac-TriS, Pfizer, (PFIZER-BIONT COVID-19 VAC-TRIS) SUSP injection Inject into the muscle. 07/28/21   Carlyle Basques, MD  metFORMIN (GLUCOPHAGE) 500 MG tablet Take 1 tablet (500 mg total) by mouth daily with breakfast. 06/22/20 07/22/20  Enrique Sack, FNP    Allergies    Acetaminophen  Review of Systems   Review of Systems  Constitutional:  Positive for chills and fatigue. Negative for fever.  HENT:  Positive for congestion and sore throat.   Eyes:  Negative for visual disturbance.  Respiratory:  Positive for cough. Negative for shortness of breath.   Cardiovascular:  Negative for chest pain.  Gastrointestinal:  Positive for diarrhea and nausea. Negative for abdominal pain and vomiting.  Genitourinary:  Negative for difficulty urinating.  Musculoskeletal:  Negative for back pain and neck stiffness.  Skin:  Negative for rash.  Neurological:  Positive for light-headedness and headaches. Negative for syncope.   Physical Exam Updated Vital Signs BP (!) 144/93   Pulse 73   Temp 98.5 F (36.9 C) (Oral)   Resp 18   Ht '5\' 8"'$  (1.727 m)   Wt 101.6 kg   SpO2 98%   BMI 34.04 kg/m   Physical Exam Vitals and nursing note reviewed.  Constitutional:  General: He is not in acute distress.    Appearance: He is well-developed. He is not diaphoretic.  HENT:     Head: Normocephalic and atraumatic.  Eyes:     Conjunctiva/sclera: Conjunctivae normal.  Cardiovascular:     Rate and Rhythm: Normal rate and regular rhythm.     Heart sounds: Normal heart sounds. No murmur heard.   No friction rub. No gallop.  Pulmonary:     Effort: Pulmonary effort is normal. No respiratory distress.     Breath sounds: Normal breath sounds. No wheezing or rales.  Abdominal:     General: There is no distension.     Palpations: Abdomen is soft.     Tenderness: There is no abdominal tenderness. There is no guarding.  Musculoskeletal:      Cervical back: Normal range of motion.  Skin:    General: Skin is warm and dry.  Neurological:     Mental Status: He is alert and oriented to person, place, and time.    ED Results / Procedures / Treatments   Labs (all labs ordered are listed, but only abnormal results are displayed) Labs Reviewed  COMPREHENSIVE METABOLIC PANEL - Abnormal; Notable for the following components:      Result Value   Total Protein 8.2 (*)    All other components within normal limits  RESP PANEL BY RT-PCR (FLU A&B, COVID) ARPGX2  CBC WITH DIFFERENTIAL/PLATELET    EKG None  Radiology No results found.  Procedures Procedures   Medications Ordered in ED Medications  sodium chloride 0.9 % bolus 1,000 mL (0 mLs Intravenous Stopped 09/08/21 1338)    ED Course  I have reviewed the triage vital signs and the nursing notes.  Pertinent labs & imaging results that were available during my care of the patient were reviewed by me and considered in my medical decision making (see chart for details).    MDM Rules/Calculators/A&P                           45 year old male with history of hyperlipidemia and DM not on medications who presents with sore throat, cough, congestion, diarrhea.  Has sick contact with son. Reports cp with coughing, low suspicion for ACS/PE in setting of other viral symptoms and not having consistent pain. Given suspicion for covid and hx of DM/not on medications, ordered labs in case he would be canddiate for paxlovid, evaluate electrolyte/renal function in setting of diarrhea. Labs within normal limits. Given IV fluids. Not hyperglycemic today, has been eating more of a healthy diet--will defer restarting DM medication to PCP given normal values today.  COVID/flu testing negative. Suspect other viral illness given combination of symptoms. Patient discharged in stable condition with understanding of reasons to return.    Final Clinical Impression(s) / ED Diagnoses Final diagnoses:   Pharyngitis, unspecified etiology  Upper respiratory tract infection, unspecified type  Diarrhea of presumed infectious origin    Rx / DC Orders ED Discharge Orders     None        Gareth Morgan, MD 09/09/21 1025

## 2022-05-30 ENCOUNTER — Encounter: Payer: Self-pay | Admitting: *Deleted

## 2022-12-19 ENCOUNTER — Emergency Department (HOSPITAL_COMMUNITY): Payer: Managed Care, Other (non HMO)

## 2022-12-19 ENCOUNTER — Other Ambulatory Visit: Payer: Self-pay

## 2022-12-19 ENCOUNTER — Emergency Department (HOSPITAL_COMMUNITY)
Admission: EM | Admit: 2022-12-19 | Discharge: 2022-12-20 | Disposition: A | Discharge status: Home / Self Care | Payer: Managed Care, Other (non HMO) | Attending: Emergency Medicine | Admitting: Emergency Medicine

## 2022-12-19 DIAGNOSIS — S161XXA Strain of muscle, fascia and tendon at neck level, initial encounter: Secondary | ICD-10-CM | POA: Diagnosis not present

## 2022-12-19 DIAGNOSIS — E119 Type 2 diabetes mellitus without complications: Secondary | ICD-10-CM | POA: Insufficient documentation

## 2022-12-19 DIAGNOSIS — I10 Essential (primary) hypertension: Secondary | ICD-10-CM | POA: Diagnosis not present

## 2022-12-19 DIAGNOSIS — Z7984 Long term (current) use of oral hypoglycemic drugs: Secondary | ICD-10-CM | POA: Diagnosis not present

## 2022-12-19 DIAGNOSIS — J069 Acute upper respiratory infection, unspecified: Secondary | ICD-10-CM

## 2022-12-19 DIAGNOSIS — S39012A Strain of muscle, fascia and tendon of lower back, initial encounter: Secondary | ICD-10-CM

## 2022-12-19 DIAGNOSIS — S3992XA Unspecified injury of lower back, initial encounter: Secondary | ICD-10-CM | POA: Diagnosis present

## 2022-12-19 DIAGNOSIS — X58XXXA Exposure to other specified factors, initial encounter: Secondary | ICD-10-CM | POA: Insufficient documentation

## 2022-12-19 NOTE — ED Triage Notes (Signed)
Presents for cough x 9 days, productively clear.  Concerned because he felt a sharp pain in his low back last night during a "coughing spell" which causing him to have difficulty ambulating (d/t pain). Has tried motrin.  Denies surgical history on back. Denies incontinence

## 2022-12-19 NOTE — ED Provider Triage Note (Signed)
  Emergency Medicine Provider Triage Evaluation Note  MRN:  [id number]  Arrival date & time: 12/19/22    Medically screening exam initiated at 11:11 PM.   CC:   Back Pain   HPI:  Kiernan Atkerson is a 46 y.o. year-old male presents to the ED with chief complaint of low back pain.  Onset last night after having a coughing spell.  States that the pain radiates down his legs.  He denies fevers.  Denies bowel or bladder incontinence.  History provided by patient. ROS:  -As included in HPI PE:   Vitals:   12/19/22 2302  BP: 113/88  Pulse: 82  Resp: 16  Temp: 98.4 F (36.9 C)  SpO2: 97%    Non-toxic appearing No respiratory distress Ambulatory MDM:  Based on signs and symptoms, muscle strain is highest on my differential, followed by intervertebral disc pathology. I've ordered an x-rays of chest due to cough and l-spine due to hx of arthritis in triage to expedite lab/diagnostic workup.  Patient was informed that the remainder of the evaluation will be completed by another provider, this initial triage assessment does not replace that evaluation, and the importance of remaining in the ED until their evaluation is complete.    Montine Circle, PA-C 12/19/22 2314

## 2022-12-20 LAB — CBG MONITORING, ED: Glucose-Capillary: 111 mg/dL — ABNORMAL HIGH (ref 70–99)

## 2022-12-20 MED ORDER — CYCLOBENZAPRINE HCL 10 MG PO TABS: 10.0000 mg | ORAL_TABLET | Freq: Two times a day (BID) | ORAL | 0 refills | Status: AC | PRN

## 2022-12-20 MED ORDER — PREDNISONE 10 MG PO TABS: 40.0000 mg | ORAL_TABLET | Freq: Every day | ORAL | 0 refills | Status: AC

## 2022-12-20 MED ORDER — BENZONATATE 200 MG PO CAPS: 200.0000 mg | ORAL_CAPSULE | Freq: Three times a day (TID) | ORAL | 0 refills | Status: AC

## 2022-12-20 NOTE — ED Provider Notes (Signed)
[facility] EMERGENCY DEPARTMENT Provider Note   CSN: [id number] Arrival date & time: 12/19/22  2209     History  Chief Complaint  Patient presents with   Back Pain    Brian Diaz is a 46 y.o. male.  46 year old male presents with complaint of low back pain onset 2 days ago when he was coughing.  States has had a cold for the past 8 days, was bending over with a really deep cough when he had this sudden pain in his lower back which radiates down both legs.  Patient is taking 600 mg of ibuprofen which helps with pain.  Denies abdominal pain, groin numbness, loss of bowel or bladder control.  Reports similar back problems several years ago, had relief with Flexeril at that time.  Reports history of hypertension and diabetes, is not currently on his medications but states he has lost weight and would like to see his doctor, declines med refill today.       Home Medications Prior to Admission medications   Medication Sig Start Date End Date Taking? Authorizing Provider  benzonatate (TESSALON) 200 MG capsule Take 1 capsule (200 mg total) by mouth every 8 (eight) hours for 10 days. 12/20/22 12/30/22 Yes Tacy Learn, PA-C  cyclobenzaprine (FLEXERIL) 10 MG tablet Take 1 tablet (10 mg total) by mouth 2 (two) times daily as needed for muscle spasms. 12/20/22  Yes Tacy Learn, PA-C  predniSONE (DELTASONE) 10 MG tablet Take 4 tablets (40 mg total) by mouth daily for 5 days. 12/20/22 12/25/22 Yes Tacy Learn, PA-C  atorvastatin (LIPITOR) 20 MG tablet Take 2 tablets (40 mg total) by mouth daily. Patient not taking: No sig reported 04/24/17   Eloise Levels, MD  COVID-19 mRNA Vac-TriS, Pfizer, (PFIZER-BIONT COVID-19 VAC-TRIS) SUSP injection Inject into the muscle. 07/28/21   Carlyle Basques, MD  metFORMIN (GLUCOPHAGE) 500 MG tablet Take 1 tablet (500 mg total) by mouth daily with breakfast. 06/22/20 07/22/20  Enrique Sack, FNP      Allergies    Acetaminophen     Review of Systems   Review of Systems Negative except as per HPI Physical Exam Updated Vital Signs BP 111/86 (BP Location: Right Arm)   Pulse 84   Temp 98.3 F (36.8 C) (Oral)   Resp 16   Wt 104.3 kg   SpO2 97%   BMI 34.97 kg/m  Physical Exam Vitals and nursing note reviewed.  Constitutional:      General: He is not in acute distress.    Appearance: He is well-developed. He is not diaphoretic.  HENT:     Head: Normocephalic and atraumatic.  Cardiovascular:     Pulses: Normal pulses.  Pulmonary:     Effort: Pulmonary effort is normal.  Musculoskeletal:        General: Tenderness present. No swelling.       Back:     Right lower leg: No edema.     Left lower leg: No edema.  Skin:    General: Skin is warm and dry.     Findings: No erythema or rash.  Neurological:     Mental Status: He is alert and oriented to person, place, and time.     Sensory: No sensory deficit.     Motor: No weakness.     Gait: Gait normal.     Deep Tendon Reflexes: Reflexes normal.     Reflex Scores:      Patellar reflexes are 2+ on the right  side and 2+ on the left side. Psychiatric:        Behavior: Behavior normal.     ED Results / Procedures / Treatments   Labs (all labs ordered are listed, but only abnormal results are displayed) Labs Reviewed  CBG MONITORING, ED - Abnormal; Notable for the following components:      Result Value   Glucose-Capillary 111 (*)    All other components within normal limits    EKG None  Radiology DG Chest 2 View  Result Date: 12/19/2022 CLINICAL DATA:  Back and chest pain aggravated by coughing EXAM: CHEST - 2 VIEW COMPARISON:  11/13/2006 FINDINGS: The heart size and mediastinal contours are within normal limits. Both lungs are clear. The visualized skeletal structures are unremarkable. IMPRESSION: No active cardiopulmonary disease. Electronically Signed   By: Placido Sou M.D.   On: 12/19/2022 23:48   DG Lumbar Spine Complete  Result Date:  12/19/2022 CLINICAL DATA:  Back pain EXAM: LUMBAR SPINE - COMPLETE 4+ VIEW COMPARISON:  Lumbar spine radiographs 05/14/2018 FINDINGS: No acute fracture or traumatic malalignment. Mild multilevel spondylosis. Normal alignment. Mild lower lumbar facet arthropathy. IMPRESSION: No acute fracture. Electronically Signed   By: Placido Sou M.D.   On: 12/19/2022 23:47    Procedures Procedures    Medications Ordered in ED Medications - No data to display  ED Course/ Medical Decision Making/ A&P                           Medical Decision Making  32-year male presents with complaint of URI x 8 days now with back pain as above.  Found to have generalized diffuse lumbar tenderness, no crepitus or step-off.  Equal lower extremity strength, reflexes symmetric.  Chest x-ray obtained does not show pneumonia or other acute process, agree with radiologist interpretation.  X-ray lumbar spine without acute injury.  Agree with radiologist interpretation. Patient reports relief with Flexeril in the past for similar back pain, is provided with short course of Flexeril, prednisone, Tessalon.  History of diabetes, not currently on medications, reports controlled with diet, exercise, weight loss.  CBG in the department is normal.  Offered to refill patient's medications, he plans to follow-up with primary care for further management monitoring.        Final Clinical Impression(s) / ED Diagnoses Final diagnoses:  Strain of lumbar region, initial encounter  Viral URI with cough    Rx / DC Orders ED Discharge Orders          Ordered    benzonatate (TESSALON) 200 MG capsule  Every 8 hours        12/20/22 1151    cyclobenzaprine (FLEXERIL) 10 MG tablet  2 times daily PRN        12/20/22 1151    predniSONE (DELTASONE) 10 MG tablet  Daily        12/20/22 1151              Roque Lias 12/20/22 1201    Cristie Hem, MD 12/20/22 [phone]

## 2022-12-20 NOTE — Discharge Instructions (Addendum)
Take Flexeril as needed as prescribed for back pain. Take Prednisone as prescribed daily, this may help with your pain and your cough. Take Tessalon as needed as prescribed for cough.  Follow up with your doctor for recheck and further management.

## 2022-12-20 NOTE — ED Notes (Signed)
Pt stated he decided to come in today d/t shooting pain in back after getting out of shower.   Pt stated a few years ago he had a similar situation and he received Flexeril which helped.

## 2023-06-27 ENCOUNTER — Encounter: Payer: Self-pay | Admitting: Gastroenterology
